# Patient Record
Sex: Female | Born: 1970 | Race: White | Hispanic: No | Marital: Married | Smoking: Current every day smoker
Health system: Southern US, Community
[De-identification: ages and names within clinical notes are randomized; demographics above are authoritative.]

## PROBLEM LIST (undated history)

## (undated) DIAGNOSIS — R519 Headache, unspecified: Secondary | ICD-10-CM

## (undated) DIAGNOSIS — I10 Essential (primary) hypertension: Secondary | ICD-10-CM

## (undated) DIAGNOSIS — K219 Gastro-esophageal reflux disease without esophagitis: Secondary | ICD-10-CM

## (undated) DIAGNOSIS — J45909 Unspecified asthma, uncomplicated: Secondary | ICD-10-CM

## (undated) DIAGNOSIS — R51 Headache: Secondary | ICD-10-CM

## (undated) DIAGNOSIS — F411 Generalized anxiety disorder: Secondary | ICD-10-CM

## (undated) DIAGNOSIS — T7840XA Allergy, unspecified, initial encounter: Secondary | ICD-10-CM

## (undated) HISTORY — DX: Allergy, unspecified, initial encounter: T78.40XA

## (undated) HISTORY — DX: Gastro-esophageal reflux disease without esophagitis: K21.9

## (undated) HISTORY — DX: Unspecified asthma, uncomplicated: J45.909

## (undated) HISTORY — DX: Headache: R51

## (undated) HISTORY — DX: Generalized anxiety disorder: F41.1

## (undated) HISTORY — DX: Headache, unspecified: R51.9

## (undated) HISTORY — DX: Essential (primary) hypertension: I10

---

## 2004-08-11 HISTORY — PX: ABDOMINAL HYSTERECTOMY: SHX81

## 2016-05-20 ENCOUNTER — Ambulatory Visit (INDEPENDENT_AMBULATORY_CARE_PROVIDER_SITE_OTHER): Payer: Managed Care, Other (non HMO) | Admitting: Primary Care

## 2016-05-20 ENCOUNTER — Encounter: Payer: Self-pay | Admitting: Primary Care

## 2016-05-20 ENCOUNTER — Encounter: Payer: Self-pay | Admitting: Radiology

## 2016-05-20 VITALS — BP 146/92 | HR 82 | Temp 98.2°F | Ht 64.0 in | Wt 192.4 lb

## 2016-05-20 DIAGNOSIS — K219 Gastro-esophageal reflux disease without esophagitis: Secondary | ICD-10-CM

## 2016-05-20 DIAGNOSIS — I10 Essential (primary) hypertension: Secondary | ICD-10-CM

## 2016-05-20 DIAGNOSIS — J4540 Moderate persistent asthma, uncomplicated: Secondary | ICD-10-CM

## 2016-05-20 DIAGNOSIS — F411 Generalized anxiety disorder: Secondary | ICD-10-CM

## 2016-05-20 DIAGNOSIS — E538 Deficiency of other specified B group vitamins: Secondary | ICD-10-CM | POA: Diagnosis not present

## 2016-05-20 LAB — VITAMIN B12: Vitamin B-12: 589 pg/mL (ref 211–911)

## 2016-05-20 MED ORDER — ATENOLOL 25 MG PO TABS: 25.0000 mg | ORAL_TABLET | Freq: Every day | ORAL | 1 refills | Status: DC

## 2016-05-20 MED ORDER — ATENOLOL 25 MG PO TABS
25.0000 mg | ORAL_TABLET | Freq: Every day | ORAL | 1 refills | Status: DC
Start: 2016-05-20 — End: 2016-10-15

## 2016-05-20 MED ORDER — CLONAZEPAM 1 MG PO TABS: 1.0000 mg | ORAL_TABLET | Freq: Two times a day (BID) | ORAL | 0 refills | Status: DC | PRN

## 2016-05-20 MED ORDER — CLONAZEPAM 1 MG PO TABS: 1.0000 mg | ORAL_TABLET | Freq: Two times a day (BID) | ORAL | 0 refills | Status: DC

## 2016-05-20 MED ORDER — BUDESONIDE 180 MCG/ACT IN AEPB: 1.0000 | INHALATION_SPRAY | Freq: Two times a day (BID) | RESPIRATORY_TRACT | 1 refills | Status: DC

## 2016-05-20 NOTE — Progress Notes (Signed)
Subjective:    Patient ID: Tamara Robinson, female    DOB: 12-29-1970, 45 y.o.   MRN: [id number]  HPI  Tamara Robinson is a 45 year old female who presents today to establish care and discuss the problems mentioned below. Will obtain old records. She just moved to the area from [place] two weeks ago and has been out of all of her medications since. Her last physical was over 1 year ago.  1) Generalized Anxiety Disorder: Diagnosed in childhood. Currently managed on Clonazepam 1 mg BID for which she's taken for 3 years, and Trazodone 50 mg HS for difficulty sleeping for which she has taken for the past 1 month. She will wake up during the night 1-2 times.   She has not had either of these medications for 2 weeks. She's previously failed treatment on Paxil, Wellbutrin, Lexapro, lorazepam, numerous other medications for which she cannot remember. She does not wish to try other medications for anxiety as she's failed numerous other treatments. Without her Clonazepam she will feel anxious, nausea, shaking. She wakes up feeling this way most of the days. Clonazepam has been the only medication that's helped her symptoms. She denies depression, suicidal thoughts. She was once managed by psychiatry and therapy without improvement. She always felt like they never listened.  2) GERD: Diagnosed 20 years ago. Currently managed on omeprazole 40 mg. She's tried OTC pepcid and zantac without improvement. She will experience sour burps, heartburn, nausea without her medication.   3) Essential Hypertension: Diagnosed one year ago, also with palpitations. Currently managed on Atenolol 25 mg once daily. Her BP is above goal in the clinic today at 146/92. She's not had her Atenolol in 2 weeks. She has noticed headaches. Denies chest pain.   4) Vitamin B 12 Deficiency: Diagnosed 3 years ago and injects herself at home once monthly. Her last B 12 injections was 2 months ago.   5) Asthma: Diagnosed as a child. She will  experiencing cough, produce a lot mucous, and wheezing daily. Managed on albuterol HFA inhaler for which she uses 2-3 times daily every day. She has smoked for the past 20 years and is currently smoking 1 PPD. Denies hemoptysis and unexplained weight loss. She has never been on a controller inhaler for her asthma.  Review of Systems  Constitutional: Negative for fever and unexpected weight change.  HENT: Positive for congestion.   Eyes: Negative for visual disturbance.  Respiratory: Positive for cough and shortness of breath. Negative for wheezing.   Cardiovascular: Negative for chest pain.  Gastrointestinal:       Esophageal reflux  Genitourinary:       Hysterectomy  Allergic/Immunologic: Positive for environmental allergies.  Neurological: Positive for headaches. Negative for dizziness.  Psychiatric/Behavioral: Positive for sleep disturbance. Negative for suicidal ideas. The patient is nervous/anxious.        Past Medical History:  Diagnosis Date  . Allergy   . Asthma   . Frequent headaches   . GAD (generalized anxiety disorder)   . GERD (gastroesophageal reflux disease)   . Hypertension      Social History   Social History  . Marital status: Married    Spouse name: N/A  . Number of children: N/A  . Years of education: N/A   Occupational History  . Not on file.   Social History Main Topics  . Smoking status: Current Every Day Smoker    Packs/day: 1.00  . Smokeless tobacco: Not on file  . Alcohol use Yes  Comment: weeky  . Drug use: Unknown  . Sexual activity: Not on file   Other Topics Concern  . Not on file   Social History Narrative  . No narrative on file    Past Surgical History:  Procedure Laterality Date  . ABDOMINAL HYSTERECTOMY  2006    No family history on file.  No Known Allergies  No current outpatient prescriptions on file prior to visit.   No current facility-administered medications on file prior to visit.     BP (!) 146/92    Pulse 82   Temp 98.2 F (36.8 C) (Oral)   Ht 5\' 4"  (1.626 m)   Wt 192 lb 6.4 oz (87.3 kg)   SpO2 97%   BMI 33.03 kg/m    Objective:   Physical Exam  Constitutional: She is oriented to person, place, and time. She appears well-nourished.  Neck: Neck supple.  Cardiovascular: Normal rate and regular rhythm.   Pulmonary/Chest: Effort normal and breath sounds normal.  Musculoskeletal: Normal range of motion.  Neurological: She is alert and oriented to person, place, and time.  Skin: Skin is warm and dry.  Psychiatric: She has a normal mood and affect.          Assessment & Plan:

## 2016-05-20 NOTE — Progress Notes (Signed)
Pre visit review using our clinic review tool, if applicable. No additional management support is needed unless otherwise documented below in the visit note. 

## 2016-05-20 NOTE — Patient Instructions (Addendum)
Start Pulmicort Inhaler for asthma. Inhale 1 puff into the lungs twice daily, everyday. You may increase to 2 puffs twice daily if little to no improvement.  Use the albuterol inhaler as needed for wheezing/shortness of breath. You should ideally be using this less than 3 times weekly.   I sent refills for Atenolol to your pharmacy.  I will temporarily refill your Clonazepam as discussed. Consider decreasing your dose to 1/2 tablet twice daily as our goal will be to wean you off.  Please sign the controlled substance contract and complete the urine drug screen as discussed. Complete lab work prior to leaving today. I will notify you of your results once received.   Please schedule a physical with me in 3 months. You may also schedule a lab only appointment 3-4 days prior. We will discuss your lab results in detail during your physical. We will then re-evaluate your anxiety.  It was a pleasure to meet you today! Please don't hesitate to call me with any questions. Welcome to [facility]!

## 2016-05-20 NOTE — Assessment & Plan Note (Signed)
Diagnosed one year ago and is managed on atenolol 25 mg. No medications and the last 2 weeks, BP above goal in clinic today. Refill provided for atenolol 25 mg. She will monitor blood pressure and notify us if no improvement.

## 2016-05-20 NOTE — Assessment & Plan Note (Signed)
Failed treatment on numerous medications from different classes. Clonazepam works well and is the only thing that she feels stable taking. Long discussion today regarding long-term effects of this medication and goals for weaning off in the future. 3 month supply provided today, discussed for her to try to break tablets in half. UDS and controlled substances contract obtained today. Will reevaluate in 3 months.

## 2016-05-20 NOTE — Assessment & Plan Note (Signed)
Managed on omeprazole 40 mg. Experiences relapse of symptoms if off medication. No improvement with H2 blockers. She understands the long-term effects of PPI management.

## 2016-05-20 NOTE — Assessment & Plan Note (Signed)
Daily shortness of breath, wheezing, cough. Using albuterol inhaler 2-3 times daily every day. Prescription for Pulmicort inhaler sent to pharmacy for daily use. Will call patient in one month for an update. Discussed goal of using albuterol 3 times weekly.

## 2016-05-20 NOTE — Assessment & Plan Note (Signed)
Managed on monthly injections. Obtain vitamin B12 level today.

## 2016-06-20 ENCOUNTER — Telehealth: Payer: Self-pay | Admitting: Primary Care

## 2016-06-20 NOTE — Telephone Encounter (Signed)
-----   Message from Doreene NestKatherine K Tima Curet, NP sent at 05/20/2016  2:43 PM EDT ----- Regarding: Asthma How's she doing on the Pulmicort inhaler for asthma? Any improvement? How many puffs is she taking at a time?

## 2016-06-26 NOTE — Telephone Encounter (Signed)
Spoken to patient. She stated that the Pulmicort inhaler is helping and very much improve. She does just 1 puff daily.

## 2016-06-26 NOTE — Telephone Encounter (Signed)
Noted  

## 2016-07-13 ENCOUNTER — Other Ambulatory Visit: Payer: Self-pay | Admitting: Primary Care

## 2016-07-13 DIAGNOSIS — J4540 Moderate persistent asthma, uncomplicated: Secondary | ICD-10-CM

## 2016-07-14 NOTE — Telephone Encounter (Signed)
Ok to refill? Electronically refill request for   budesonide (PULMICORT FLEXHALER) 180 MCG/ACT inhaler  Last prescribed and seen on 05/20/2016.

## 2016-08-07 ENCOUNTER — Other Ambulatory Visit: Payer: Self-pay | Admitting: Primary Care

## 2016-08-07 DIAGNOSIS — I10 Essential (primary) hypertension: Secondary | ICD-10-CM

## 2016-08-07 DIAGNOSIS — Z0000 Encounter for general adult medical examination without abnormal findings: Secondary | ICD-10-CM

## 2016-08-07 DIAGNOSIS — E538 Deficiency of other specified B group vitamins: Secondary | ICD-10-CM

## 2016-08-19 ENCOUNTER — Ambulatory Visit (INDEPENDENT_AMBULATORY_CARE_PROVIDER_SITE_OTHER): Payer: 59 | Admitting: Primary Care

## 2016-08-19 ENCOUNTER — Other Ambulatory Visit: Payer: Managed Care, Other (non HMO)

## 2016-08-19 ENCOUNTER — Encounter: Payer: Self-pay | Admitting: Primary Care

## 2016-08-19 VITALS — BP 120/80 | HR 87 | Ht 63.75 in | Wt 210.0 lb

## 2016-08-19 DIAGNOSIS — E049 Nontoxic goiter, unspecified: Secondary | ICD-10-CM | POA: Diagnosis not present

## 2016-08-19 DIAGNOSIS — E538 Deficiency of other specified B group vitamins: Secondary | ICD-10-CM | POA: Diagnosis not present

## 2016-08-19 DIAGNOSIS — J4540 Moderate persistent asthma, uncomplicated: Secondary | ICD-10-CM

## 2016-08-19 DIAGNOSIS — Z0000 Encounter for general adult medical examination without abnormal findings: Secondary | ICD-10-CM | POA: Diagnosis not present

## 2016-08-19 DIAGNOSIS — Z1239 Encounter for other screening for malignant neoplasm of breast: Secondary | ICD-10-CM

## 2016-08-19 DIAGNOSIS — F411 Generalized anxiety disorder: Secondary | ICD-10-CM | POA: Diagnosis not present

## 2016-08-19 DIAGNOSIS — I10 Essential (primary) hypertension: Secondary | ICD-10-CM

## 2016-08-19 DIAGNOSIS — R7303 Prediabetes: Secondary | ICD-10-CM

## 2016-08-19 DIAGNOSIS — Z1231 Encounter for screening mammogram for malignant neoplasm of breast: Secondary | ICD-10-CM

## 2016-08-19 LAB — COMPREHENSIVE METABOLIC PANEL
ALBUMIN: 4.5 g/dL (ref 3.5–5.2)
ALK PHOS: 92 U/L (ref 39–117)
ALT: 32 U/L (ref 0–35)
AST: 26 U/L (ref 0–37)
BUN: 11 mg/dL (ref 6–23)
CALCIUM: 10 mg/dL (ref 8.4–10.5)
CO2: 25 mEq/L (ref 19–32)
Chloride: 105 mEq/L (ref 96–112)
Creatinine, Ser: 0.75 mg/dL (ref 0.40–1.20)
GFR: 88.35 mL/min (ref >60.00)
Glucose, Bld: 97 mg/dL (ref 70–99)
POTASSIUM: 4.4 meq/L (ref 3.5–5.1)
SODIUM: 139 meq/L (ref 135–145)
TOTAL PROTEIN: 7.3 g/dL (ref 6.0–8.3)
Total Bilirubin: 0.3 mg/dL (ref 0.2–1.2)

## 2016-08-19 LAB — LIPID PANEL
CHOL/HDL RATIO: 3
Cholesterol: 205 mg/dL — ABNORMAL HIGH (ref 0–200)
HDL: 68.6 mg/dL (ref >39.00)
LDL Cholesterol: 122 mg/dL — ABNORMAL HIGH (ref 0–99)
NONHDL: 136.25
Triglycerides: 70 mg/dL (ref 0.0–149.0)
VLDL: 14 mg/dL (ref 0.0–40.0)

## 2016-08-19 LAB — TSH: TSH: 0.98 u[IU]/mL (ref 0.35–4.50)

## 2016-08-19 LAB — HEMOGLOBIN A1C: HEMOGLOBIN A1C: 5.8 % (ref 4.6–6.5)

## 2016-08-19 LAB — VITAMIN B12: VITAMIN B 12: 330 pg/mL (ref 211–911)

## 2016-08-19 MED ORDER — CLONAZEPAM 1 MG PO TABS: 1.0000 mg | ORAL_TABLET | Freq: Two times a day (BID) | ORAL | 0 refills | Status: DC | PRN

## 2016-08-19 NOTE — Progress Notes (Signed)
Subjective:    Patient ID: Tamara Robinson, female    DOB: [birth year], 46 y.o.   MRN: [id number]  HPI  [name] is a 46 year old female who presents today for complete physical and follow up for chronic anxiety.   Immunizations: -Tetanus: Completed within 5 years. -Influenza: Declines.  Diet: She endorses a fair diet. Breakfast: Fruit, fast food, bagel Lunch: Sandwich, fast food Dinner: Meat, vegetable, stach Snacks: Nuts, chips Desserts: Several times weekly Beverages: Water, coffee, occasional sweet tea  Exercise: She does not currently exercise. Eye exam: Completed several years ago, due soon. Dental exam: Completes annually. Pap Smear: Hysterectomy Mammogram: Completed over 10 years ago. Due.  1) GAD: Long history of anxiety and has failed numerous medications from various clasess from numerous providers. She presented as a new patient in October 2017 and was managed solely on Clonazepam for which she took twice daily. She was very anxious at that time as she had been out of her medication for several weeks. It was explained to her at that time that this was an as needed medication and that our goal was to wean her down. She was provided with a refill and told to follow up in three months.  She's been taking her Clonazepam twice daily since her last visit. She's been on Clonazepam twice daily for 3 years. She does very well on this medication. Without her Clonazepam she starts to self medicate with alcohol and marijuana. She doesn't drink alcohol while taking her medication. Her husband recently returned from being overseas for the past 10 years.  Review of Systems  Constitutional: Negative for unexpected weight change.  HENT: Positive for tinnitus. Negative for rhinorrhea.        Increased tinnitus to bilateral ears over the last 1 month.   Respiratory: Negative for cough and shortness of breath.   Cardiovascular: Negative for chest pain.  Gastrointestinal: Negative for  constipation and diarrhea.  Genitourinary: Negative for difficulty urinating and menstrual problem.       Hot flashes at night  Musculoskeletal: Negative for arthralgias and myalgias.  Skin: Negative for rash.  Allergic/Immunologic: Negative for environmental allergies.  Neurological: Negative for dizziness, numbness and headaches.  Psychiatric/Behavioral:       Anxiety much improved on Clonazepam       Past Medical History:  Diagnosis Date  . Allergy   . Asthma   . Frequent headaches   . GAD (generalized anxiety disorder)   . GERD (gastroesophageal reflux disease)   . Hypertension      Social History   Social History  . Marital status: Married    Spouse name: N/A  . Number of children: N/A  . Years of education: N/A   Occupational History  . Not on file.   Social History Main Topics  . Smoking status: Current Every Day Smoker    Packs/day: 1.00  . Smokeless tobacco: Not on file  . Alcohol use Yes     Comment: weeky  . Drug use: Unknown  . Sexual activity: Not on file   Other Topics Concern  . Not on file   Social History Narrative  . No narrative on file    Past Surgical History:  Procedure Laterality Date  . ABDOMINAL HYSTERECTOMY  2006    No family history on file.  No Known Allergies  Current Outpatient Prescriptions on File Prior to Visit  Medication Sig Dispense Refill  . albuterol (VENTOLIN HFA) 108 (90 Base) MCG/ACT inhaler Inhale 2 puffs  into the lungs every 6 (six) hours as needed for wheezing or shortness of breath.    Marland Kitchen. atenolol (TENORMIN) 25 MG tablet Take 1 tablet (25 mg total) by mouth daily. 90 tablet 1  . budesonide (PULMICORT FLEXHALER) 180 MCG/ACT inhaler Inhale 1 puff into the lungs 2 (two) times daily. 1 each 5  . omeprazole (PRILOSEC) 40 MG capsule Take 40 mg by mouth daily.    Marland Kitchen. VITAMIN D, CHOLECALCIFEROL, PO Take by mouth.     No current facility-administered medications on file prior to visit.     BP 120/80   Pulse 87    Ht 5' 3.75" (1.619 m)   Wt 210 lb (95.3 kg)   SpO2 99%   BMI 36.33 kg/m    Objective:   Physical Exam  Constitutional: She is oriented to person, place, and time. She appears well-nourished.  HENT:  Right Ear: Tympanic membrane and ear canal normal.  Left Ear: Tympanic membrane and ear canal normal.  Nose: Nose normal.  Mouth/Throat: Oropharynx is clear and moist.  Eyes: Conjunctivae and EOM are normal. Pupils are equal, round, and reactive to light.  Neck: Neck supple. No thyromegaly present.  Cardiovascular: Normal rate and regular rhythm.   No murmur heard. Pulmonary/Chest: Effort normal and breath sounds normal. She has no rales.  Abdominal: Soft. Bowel sounds are normal. There is no tenderness.  Musculoskeletal: Normal range of motion.  Lymphadenopathy:    She has no cervical adenopathy.  Neurological: She is alert and oriented to person, place, and time. She has normal reflexes. No cranial nerve deficit.  Skin: Skin is warm and dry. No rash noted.  Psychiatric: She has a normal mood and affect.  Anxiety much improved on Clonazepam.          Assessment & Plan:

## 2016-08-19 NOTE — Assessment & Plan Note (Signed)
Much improved on Pulmicort with irregular use of albuterol inhaler. Exam today without wheezing. Discussed to stop smoking.

## 2016-08-19 NOTE — Assessment & Plan Note (Signed)
Td UTD, declines influenza vaccination. Mammogram due, ordered today. Discussed the importance of a healthy diet and regular exercise in order for weight loss, and to reduce the risk of other medical diseases. Exam unremarkable. Labs with prediabetes, will continue to monitor. Follow up in 1 year for annual physical.

## 2016-08-19 NOTE — Assessment & Plan Note (Signed)
B12 stable on recent labs.

## 2016-08-19 NOTE — Patient Instructions (Addendum)
Complete lab work prior to leaving today. I will notify you of your results once received.   Call the [facility] to schedule your mammogram.  It's important to improve your diet by reducing consumption of fast food, fried food, processed snack foods, sugary drinks. Increase consumption of fresh vegetables and fruits, whole grains, water.  Ensure you are drinking 64 ounces of water daily.  Start exercising. You should be getting 150 minutes of moderate intensity exercise weekly.  Follow up in 6 months for re-evaluation or sooner if needed.  It was a pleasure to see you today!

## 2016-08-19 NOTE — Assessment & Plan Note (Signed)
Long discussion today regarding use of Clonazepam as sole treatment for anxiety. She has failed numerous medications from numerous classes prescribed by both PCP's and psychiatry.  She is much better with the Clonazepam when compared to last visit. Discussed that her body is addicted to this medication, she understands this.  Discussed that she cannot drink alcohol or smoke marijiana with this medication, she verbalized understanding. Since she's tried nearly every medication agreed to continue Clonazepam. UDS and controlled substance contract UTD. She will follow up every 6 months for re-evaluation.

## 2016-08-19 NOTE — Assessment & Plan Note (Signed)
Likely from poor diet and lack of exercise. Discussed to work on both, recheck A1C in 6 months.

## 2016-08-19 NOTE — Assessment & Plan Note (Signed)
Stable today, continue atenolol.

## 2016-08-21 ENCOUNTER — Encounter: Payer: Managed Care, Other (non HMO) | Admitting: Primary Care

## 2016-10-06 ENCOUNTER — Ambulatory Visit: Payer: 59 | Admitting: Primary Care

## 2016-10-15 ENCOUNTER — Other Ambulatory Visit: Payer: Self-pay | Admitting: Primary Care

## 2016-10-15 DIAGNOSIS — I10 Essential (primary) hypertension: Secondary | ICD-10-CM

## 2016-10-15 DIAGNOSIS — K219 Gastro-esophageal reflux disease without esophagitis: Secondary | ICD-10-CM

## 2016-10-15 NOTE — Telephone Encounter (Signed)
Ok to refill? Electronically refill request for omeprazole (PRILOSEC) 40 MG capsule. Medication have not been prescribed by Jae DireKate. Last seen on 08/19/2016

## 2016-11-12 ENCOUNTER — Other Ambulatory Visit: Payer: Self-pay | Admitting: Primary Care

## 2016-11-12 DIAGNOSIS — F411 Generalized anxiety disorder: Secondary | ICD-10-CM

## 2016-11-12 NOTE — Telephone Encounter (Signed)
Ok to refill? Electronically refill request for clonazePAM (KLONOPIN) 1 MG tablet. Last prescribed and seen on 08/19/2016.

## 2016-11-13 NOTE — Telephone Encounter (Deleted)
Patient is due for USD.

## 2016-11-13 NOTE — Addendum Note (Signed)
Addended by: Tawnya Crook on: 11/13/2016 03:55 PM   Modules accepted: Orders

## 2016-11-14 ENCOUNTER — Other Ambulatory Visit: Payer: Self-pay | Admitting: Primary Care

## 2016-11-14 DIAGNOSIS — F411 Generalized anxiety disorder: Secondary | ICD-10-CM

## 2016-11-14 NOTE — Telephone Encounter (Signed)
Called in medication to the pharmacy as instructed. 

## 2016-11-21 ENCOUNTER — Other Ambulatory Visit: Payer: Self-pay | Admitting: Primary Care

## 2016-11-21 DIAGNOSIS — I10 Essential (primary) hypertension: Secondary | ICD-10-CM

## 2016-11-21 MED ORDER — ATENOLOL 25 MG PO TABS
25.0000 mg | ORAL_TABLET | Freq: Every day | ORAL | 0 refills | Status: DC
Start: 2016-11-21 — End: 2017-02-20

## 2016-11-21 NOTE — Telephone Encounter (Signed)
Received faxed refill request for atenolol (TENORMIN) 25 MG tablet. Last prescribed on 10/15/2016.   Will change to 90 days supply.

## 2017-01-09 ENCOUNTER — Ambulatory Visit: Payer: Self-pay | Admitting: Internal Medicine

## 2017-01-09 ENCOUNTER — Encounter (HOSPITAL_COMMUNITY): Payer: Self-pay | Admitting: Emergency Medicine

## 2017-01-09 ENCOUNTER — Emergency Department (HOSPITAL_COMMUNITY)
Admission: EM | Admit: 2017-01-09 | Discharge: 2017-01-09 | Disposition: A | Discharge status: Home / Self Care | Payer: 59 | Attending: Physician Assistant | Admitting: Physician Assistant

## 2017-01-09 ENCOUNTER — Telehealth: Payer: Self-pay | Admitting: Primary Care

## 2017-01-09 ENCOUNTER — Emergency Department (HOSPITAL_COMMUNITY): Payer: 59

## 2017-01-09 DIAGNOSIS — J45909 Unspecified asthma, uncomplicated: Secondary | ICD-10-CM | POA: Insufficient documentation

## 2017-01-09 DIAGNOSIS — I10 Essential (primary) hypertension: Secondary | ICD-10-CM | POA: Insufficient documentation

## 2017-01-09 DIAGNOSIS — Z79899 Other long term (current) drug therapy: Secondary | ICD-10-CM | POA: Insufficient documentation

## 2017-01-09 DIAGNOSIS — F17200 Nicotine dependence, unspecified, uncomplicated: Secondary | ICD-10-CM | POA: Diagnosis not present

## 2017-01-09 DIAGNOSIS — M5442 Lumbago with sciatica, left side: Secondary | ICD-10-CM

## 2017-01-09 DIAGNOSIS — M545 Low back pain: Secondary | ICD-10-CM | POA: Diagnosis present

## 2017-01-09 LAB — URINALYSIS, ROUTINE W REFLEX MICROSCOPIC
Bilirubin Urine: NEGATIVE
Glucose, UA: NEGATIVE mg/dL
Hgb urine dipstick: NEGATIVE
KETONES UR: NEGATIVE mg/dL
LEUKOCYTES UA: NEGATIVE
NITRITE: NEGATIVE
PH: 5 (ref 5.0–8.0)
PROTEIN: NEGATIVE mg/dL
Specific Gravity, Urine: 1.006 (ref 1.005–1.030)

## 2017-01-09 MED ORDER — ONDANSETRON HCL 4 MG PO TABS
4.0000 mg | ORAL_TABLET | Freq: Once | ORAL | Status: AC
  Administered 2017-01-09: 4 mg via ORAL
  Filled 2017-01-09: qty 1

## 2017-01-09 MED ORDER — LIDOCAINE 5 % EX PTCH
1.0000 | MEDICATED_PATCH | CUTANEOUS | Status: DC
  Administered 2017-01-09: 1 via TRANSDERMAL
  Filled 2017-01-09: qty 1

## 2017-01-09 MED ORDER — CYCLOBENZAPRINE HCL 10 MG PO TABS
5.0000 mg | ORAL_TABLET | Freq: Once | ORAL | Status: AC
  Administered 2017-01-09: 5 mg via ORAL
  Filled 2017-01-09: qty 1

## 2017-01-09 MED ORDER — NAPROXEN 250 MG PO TABS
500.0000 mg | ORAL_TABLET | Freq: Once | ORAL | Status: AC
  Administered 2017-01-09: 500 mg via ORAL
  Filled 2017-01-09: qty 2

## 2017-01-09 MED ORDER — NAPROXEN 500 MG PO TABS: 500.0000 mg | ORAL_TABLET | Freq: Two times a day (BID) | ORAL | 0 refills | Status: DC

## 2017-01-09 MED ORDER — CYCLOBENZAPRINE HCL 10 MG PO TABS: 10.0000 mg | ORAL_TABLET | Freq: Two times a day (BID) | ORAL | 0 refills | Status: DC | PRN

## 2017-01-09 MED ORDER — OXYCODONE-ACETAMINOPHEN 5-325 MG PO TABS
1.0000 | ORAL_TABLET | Freq: Once | ORAL | Status: AC
  Administered 2017-01-09: 1 via ORAL
  Filled 2017-01-09: qty 1

## 2017-01-09 NOTE — Telephone Encounter (Signed)
Pt has appt with Dr Oliver BarreJames John on 01/09/17 at 4:15.

## 2017-01-09 NOTE — ED Notes (Signed)
Pt has returned from xray

## 2017-01-09 NOTE — Discharge Instructions (Signed)
Xrays were reassuring, we want you to continue to use back exercises, go to her primary care physician get a referral for physical therapy. You may require additional imaging if your back pain continues. Please return with any that symptoms we discussed as being concerning.

## 2017-01-09 NOTE — ED Provider Notes (Signed)
[facility]-EMERGENCY DEPT Provider Note   CSN: [id number] Arrival date & time: 01/09/17  1059     History   Chief Complaint Chief Complaint  Patient presents with  . Back Pain    HPI Tamara Robinson is a 46 y.o. female.   Back Pain   This is a new problem. The current episode started 2 days ago. The problem occurs constantly. The problem has not changed since onset.The pain is associated with no known injury. The pain is present in the lumbar spine. The quality of the pain is described as stabbing and shooting. The pain radiates to the left thigh. The pain is at a severity of 4/10. The pain is moderate. The symptoms are aggravated by twisting, bending and certain positions. The pain is the same all the time. Pertinent negatives include no chest pain, no fever, no numbness, no weight loss, no bowel incontinence, no bladder incontinence, no dysuria, no tingling and no weakness. She has tried NSAIDs and bed rest for the symptoms. The treatment provided no relief.       Past Medical History:  Diagnosis Date  . Allergy   . Asthma   . Frequent headaches   . GAD (generalized anxiety disorder)   . GERD (gastroesophageal reflux disease)   . Hypertension     Patient Active Problem List   Diagnosis Date Noted  . Preventative health care 08/19/2016  . Prediabetes 08/19/2016  . Essential hypertension 05/20/2016  . Moderate persistent asthma without complication 05/20/2016  . GAD (generalized anxiety disorder) 05/20/2016  . Vitamin B 12 deficiency 05/20/2016  . GERD (gastroesophageal reflux disease) 05/20/2016    Past Surgical History:  Procedure Laterality Date  . ABDOMINAL HYSTERECTOMY  2006    OB History    No data available       Home Medications    Prior to Admission medications   Medication Sig Start Date End Date Taking? Authorizing Provider  albuterol (VENTOLIN HFA) 108 (90 Base) MCG/ACT inhaler Inhale 2 puffs into the lungs every 6 (six) hours as needed for wheezing or  shortness of breath.   Yes [provider]  atenolol (TENORMIN) 25 MG tablet Take 1 tablet (25 mg total) by mouth daily. 11/21/16  Yes Doreene Nest, NP  budesonide (PULMICORT FLEXHALER) 180 MCG/ACT inhaler Inhale 1 puff into the lungs 2 (two) times daily. 07/14/16  Yes Doreene Nest, NP  clonazePAM (KLONOPIN) 1 MG tablet TAKE 1 TABLET BY MOUTH TWICE A DAY AS NEEDED FOR ANXIETY. USE THIS MEDICATION SPARINGLY 11/12/16  Yes Doreene Nest, NP  Multiple Vitamin (MULTIVITAMIN) capsule Take 1 capsule by mouth daily.   Yes [provider]  omeprazole (PRILOSEC) 40 MG capsule TAKE ONE CAPSULE BY MOUTH EVERY DAY FOR30 DAYS 10/15/16  Yes Doreene Nest, NP  cyclobenzaprine (FLEXERIL) 10 MG tablet Take 1 tablet (10 mg total) by mouth 2 (two) times daily as needed for muscle spasms. 01/09/17   Eagan Shifflett Lyn, MD  naproxen (NAPROSYN) 500 MG tablet Take 1 tablet (500 mg total) by mouth 2 (two) times daily. 01/09/17   Akayla Brass, Cindee Salt, MD    Family History No family history on file.  Social History Social History  Substance Use Topics  . Smoking status: Current Every Day Smoker    Packs/day: 1.00  . Smokeless tobacco: Not on file  . Alcohol use Yes     Comment: weeky     Allergies   Patient has no known allergies.   Review of Systems  Review of Systems  Constitutional: Negative for fever and weight loss.  Cardiovascular: Negative for chest pain.  Gastrointestinal: Negative for bowel incontinence.  Genitourinary: Negative for bladder incontinence and dysuria.  Musculoskeletal: Positive for back pain.  Neurological: Negative for tingling, weakness and numbness.     Physical Exam Updated Vital Signs BP 123/89   Pulse 72   Temp 97.2 F (36.2 C) (Oral)   Resp 12   SpO2 96%   Physical Exam  Constitutional: She is oriented to person, place, and time. She appears well-developed and well-nourished.  HENT:  Head: Normocephalic and atraumatic.  Eyes:  Right eye exhibits no discharge.  Cardiovascular: Normal rate, regular rhythm and normal heart sounds.   No murmur heard. Pulmonary/Chest: Effort normal and breath sounds normal. She has no wheezes. She has no rales.  Abdominal: Soft. She exhibits no distension. There is no tenderness.  Musculoskeletal:  "Tenderness to SI joint and  mid gluteal region. Right and left straight leg test and negative.  Neurological: She is oriented to person, place, and time.  Skin: Skin is warm and dry. She is not diaphoretic.  Psychiatric: She has a normal mood and affect.  Nursing note and vitals reviewed.    ED Treatments / Results  Labs (all labs ordered are listed, but only abnormal results are displayed) Labs Reviewed  URINALYSIS, ROUTINE W REFLEX MICROSCOPIC - Abnormal; Notable for the following:       Result Value   Color, Urine STRAW (*)    All other components within normal limits    EKG  EKG Interpretation None       Radiology Dg Lumbar Spine Complete  Result Date: 01/09/2017 CLINICAL DATA:  Low back pain for 3 weeks.  No reported injury. EXAM: LUMBAR SPINE - COMPLETE 4+ VIEW COMPARISON:  None. FINDINGS: This report assumes 5 non rib-bearing lumbar vertebrae. Lumbar vertebral body heights are preserved, with no fracture. Minimal multilevel spondylosis in the visualized thoracolumbar spine, without appreciable loss of disc height. No spondylolisthesis. No significant facet arthropathy. No aggressive appearing focal osseous lesions. Abdominal aortic atherosclerosis. IMPRESSION: 1. No lumbar spine fracture or spondylolisthesis. 2. Minimal thoracolumbar spondylosis, with no appreciable loss of disc height . 3. Aortic atherosclerosis. Electronically Signed   By: Delbert PhenixJason A Poff M.D.   On: 01/09/2017 13:17   Dg Hip Unilat W Or Wo Pelvis 2-3 Views Left  Result Date: 01/09/2017 CLINICAL DATA:  Nontraumatic left hip pain. EXAM: DG HIP (WITH OR WITHOUT PELVIS) 2-3V LEFT COMPARISON:  None. FINDINGS:  There is no evidence of hip fracture or dislocation. There is no evidence of arthropathy or other focal bone abnormality. IMPRESSION: Negative. Electronically Signed   By: Marnee SpringJonathon  Watts M.D.   On: 01/09/2017 13:10    Procedures Procedures (including critical care time)  Medications Ordered in ED Medications  lidocaine (LIDODERM) 5 % 1 patch (1 patch Transdermal Patch Applied 01/09/17 1308)  oxyCODONE-acetaminophen (PERCOCET/ROXICET) 5-325 MG per tablet 1 tablet (not administered)  ondansetron (ZOFRAN) tablet 4 mg (not administered)  naproxen (NAPROSYN) tablet 500 mg (500 mg Oral Given 01/09/17 1227)  cyclobenzaprine (FLEXERIL) tablet 5 mg (5 mg Oral Given 01/09/17 1228)     Initial Impression / Assessment and Plan / ED Course  I have reviewed the triage vital signs and the nursing notes.  Pertinent labs & imaging results that were available during my care of the patient were reviewed by me and considered in my medical decision making (see chart for details).     A traumatic  back pain. She reports it occasional shoots down on her left leg. Patient has no red flags. Going on for 3 weeks. We'll have her follow-up with her primary care physician, given physical therapy, possibly advanced imaging in the future. Patient has no weakness no numbness or urinary symptoms no fevers.    Final Clinical Impressions(s) / ED Diagnoses   Final diagnoses:  Acute left-sided low back pain with left-sided sciatica    New Prescriptions New Prescriptions   CYCLOBENZAPRINE (FLEXERIL) 10 MG TABLET    Take 1 tablet (10 mg total) by mouth 2 (two) times daily as needed for muscle spasms.   NAPROXEN (NAPROSYN) 500 MG TABLET    Take 1 tablet (500 mg total) by mouth 2 (two) times daily.     Abelino Derrick, MD 01/09/17 1358

## 2017-01-09 NOTE — ED Notes (Signed)
Pt A&OX4, ambulatory at d/c with independent, steady gait, NAD. Pt verbalized understanding of d/c instructions and follow up care.

## 2017-01-09 NOTE — ED Triage Notes (Signed)
Pt reports having lower left back pain that began 3 weeks ago, she stated originally pain radiated around to abdomen but abdominal pain subsided and now pain radiates down her left leg to her knee and ankle. Pt denies any urinary symptoms.

## 2017-01-09 NOTE — Telephone Encounter (Signed)
[facility] - Client TELEPHONE ADVICE RECORD [facility] Patient Name: Tamara CornerDAWN Lietz DOB: 1970/12/16 Initial Comment Caller states, she has lower left back pain - 3 weeks. She is thinking it could be kidney stones. How have knee and ankle pain. Nurse Assessment Nurse: Debera Latalston, RN, Tinnie GensJeffrey Date/Time Lamount Cohen(Eastern Time): 01/09/2017 9:29:10 AM Confirm and document reason for call. If symptomatic, describe symptoms. ---Caller states, she has lower left back pain - 3 weeks. She is thinking it could be kidney stones. How have knee and ankle pain. Pain is 7 on 0-10 scale. Knee and ankle pain started in the last 3 days. Does the patient have any new or worsening symptoms? ---Yes Will a triage be completed? ---Yes Related visit to physician within the last 2 weeks? ---No Does the PT have any chronic conditions? (i.e. diabetes, asthma, etc.) ---Yes List chronic conditions. ---HTN Is the patient pregnant or possibly pregnant? (Ask all females between the ages of 8312-55) ---No Is this a behavioral health or substance abuse call? ---No Guidelines Guideline Title Affirmed Question Affirmed Notes Back Pain [1] Pain radiates into the thigh or further down the leg AND [2] one leg Final Disposition User See PCP When Office is Open (within 3 days) Debera Latalston, RN, Tinnie GensJeffrey Disagree/Comply: Comply

## 2017-02-04 ENCOUNTER — Ambulatory Visit (INDEPENDENT_AMBULATORY_CARE_PROVIDER_SITE_OTHER): Payer: 59 | Admitting: Primary Care

## 2017-02-04 ENCOUNTER — Encounter: Payer: Self-pay | Admitting: Primary Care

## 2017-02-04 VITALS — BP 118/78 | HR 66 | Temp 98.0°F | Ht 63.75 in | Wt 209.1 lb

## 2017-02-04 DIAGNOSIS — Z720 Tobacco use: Secondary | ICD-10-CM | POA: Diagnosis not present

## 2017-02-04 DIAGNOSIS — K219 Gastro-esophageal reflux disease without esophagitis: Secondary | ICD-10-CM | POA: Diagnosis not present

## 2017-02-04 DIAGNOSIS — M5442 Lumbago with sciatica, left side: Secondary | ICD-10-CM | POA: Diagnosis not present

## 2017-02-04 DIAGNOSIS — J4540 Moderate persistent asthma, uncomplicated: Secondary | ICD-10-CM

## 2017-02-04 DIAGNOSIS — F411 Generalized anxiety disorder: Secondary | ICD-10-CM | POA: Diagnosis not present

## 2017-02-04 DIAGNOSIS — I10 Essential (primary) hypertension: Secondary | ICD-10-CM | POA: Diagnosis not present

## 2017-02-04 MED ORDER — CYCLOBENZAPRINE HCL 10 MG PO TABS: 10.0000 mg | ORAL_TABLET | Freq: Two times a day (BID) | ORAL | 0 refills | Status: DC | PRN

## 2017-02-04 MED ORDER — BUPROPION HCL ER (SR) 150 MG PO TB12: 150.0000 mg | ORAL_TABLET | Freq: Two times a day (BID) | ORAL | 1 refills | Status: DC

## 2017-02-04 MED ORDER — CLONAZEPAM 1 MG PO TABS: ORAL_TABLET | ORAL | 0 refills | Status: DC

## 2017-02-04 MED ORDER — RANITIDINE HCL 150 MG PO TABS: ORAL_TABLET | ORAL | 1 refills | Status: DC

## 2017-02-04 NOTE — Assessment & Plan Note (Signed)
Will have her try Zantac once to twice daily rather than omeprazole. She will update if symptoms re-occur.

## 2017-02-04 NOTE — Assessment & Plan Note (Signed)
Stable in the office today, continue atenolol.  

## 2017-02-04 NOTE — Patient Instructions (Signed)
You may use the cyclobenzaprine tablets twice daily as needed for muscle spasms/low back pain.  Use the Pulmicort twice daily, everyday. Please notify me if you continue to have to use your albuterol inhaler daily.   Try ranitidine 150 mg tablets for GERD. Take 1 tablet by mouth once or twice daily. This is in place of the omeprazole.  Monitor your blood pressure and call in your readings in 2 weeks.  Start bupropion 150 mg tablets for smoking. Start by taking 1 tablet by mouth once daily for 3 days, then increase to 1 tablet twice daily there after. You MUST choose a quit date within 2 weeks of starting.  Stop by the lab for the urine drug screen.  Follow up in 6 months for re-evaluation.  It was a pleasure to see you today!

## 2017-02-04 NOTE — Progress Notes (Signed)
Subjective:    Patient ID: Tamara Robinson, female    DOB: July 30, 1971, 46 y.o.   MRN: [id number]  HPI  Tamara Robinson is a 46 year old female who presents today for follow up.  1) GAD: Currently managed on Clonazepam 1 mg BID. She's failed numerous medications from other classes for anxiety and does the best on Clonazepam. She's open to trying Wellbutrin for tobacco sensation. She is needing a refill today.  2) GERD: Currently managed on omeprazole 40 mg. She's missed several doses without symptoms. She's never tried H2 Blockers or a reduction in this dose.  3) Asthma: Currently managed on Pulmicort and albuterol. She's using her albuterol sparingly most of the time, more recently increased due to the weather changes. She's using her Pulmicort once daily rather that twice daily.  4) Essential Hypertension: Currently managed on atenolol 25 mg. Her BP in the clinic today is 120/92, 118/78 on recheck. She's checking her BP at home and is getting "high" readings but cannot remember the numbers. She denies chest pain, dizziness, headaches.  BP Readings from Last 3 Encounters:  02/04/17 118/78  01/09/17 128/74  08/19/16 120/80     5) Low Back Pain: Located to the left lower back with radiation to her left lower extremity. [facility] for a three week history of low back pain with radiculopathy, underwent lumbar plain films which were unremarkable. Overall she's feeling better on Motrin and Flexeril. She's out of the Flexeril which helped. She was taking Flexeril once daily in the afternoon.  6) Tobacco Abuse: Previously managed on Chantix in the past, had vivid dreams of a naked man climbing her bedroom walls. No other side effects. She is currently smoking 1 pack per day. She's been smoking since age 46. She is ready to quit smoking and would like assistance.  Review of Systems  Eyes: Negative for visual disturbance.  Respiratory: Negative for shortness of breath.     Cardiovascular: Negative for chest pain.  Gastrointestinal:       Occasional gerd symptoms  Musculoskeletal: Positive for back pain.  Neurological: Negative for dizziness and headaches.  Psychiatric/Behavioral: Negative for sleep disturbance and suicidal ideas. The patient is not nervous/anxious.        Past Medical History:  Diagnosis Date  . Allergy   . Asthma   . Frequent headaches   . GAD (generalized anxiety disorder)   . GERD (gastroesophageal reflux disease)   . Hypertension      Social History   Social History  . Marital status: Married    Spouse name: N/A  . Number of children: N/A  . Years of education: N/A   Occupational History  . Not on file.   Social History Main Topics  . Smoking status: Current Every Day Smoker    Packs/day: 1.00  . Smokeless tobacco: Never Used  . Alcohol use Yes     Comment: weeky  . Drug use: Unknown  . Sexual activity: Not on file   Other Topics Concern  . Not on file   Social History Narrative  . No narrative on file    Past Surgical History:  Procedure Laterality Date  . ABDOMINAL HYSTERECTOMY  2006    No family history on file.  No Known Allergies  Current Outpatient Prescriptions on File Prior to Visit  Medication Sig Dispense Refill  . albuterol (VENTOLIN HFA) 108 (90 Base) MCG/ACT inhaler Inhale 2 puffs into the lungs every 6 (six) hours as needed for  wheezing or shortness of breath.    Marland Kitchen atenolol (TENORMIN) 25 MG tablet Take 1 tablet (25 mg total) by mouth daily. 90 tablet 0  . budesonide (PULMICORT FLEXHALER) 180 MCG/ACT inhaler Inhale 1 puff into the lungs 2 (two) times daily. 1 each 5  . Multiple Vitamin (MULTIVITAMIN) capsule Take 1 capsule by mouth daily.    Marland Kitchen omeprazole (PRILOSEC) 40 MG capsule TAKE ONE CAPSULE BY MOUTH EVERY DAY FOR30 DAYS 90 capsule 3   No current facility-administered medications on file prior to visit.     BP 118/78   Pulse 66   Temp 98 F (36.7 C) (Oral)   Ht 5' 3.75" (1.619  m)   Wt 209 lb 1.9 oz (94.9 kg)   SpO2 97%   BMI 36.18 kg/m    Objective:   Physical Exam  Constitutional: She appears well-nourished.  Neck: Neck supple.  Cardiovascular: Normal rate and regular rhythm.   Pulmonary/Chest: Effort normal and breath sounds normal.  Musculoskeletal: Normal range of motion.  Skin: Skin is warm and dry.  Psychiatric: She has a normal mood and affect.          Assessment & Plan:

## 2017-02-04 NOTE — Assessment & Plan Note (Signed)
Not using pulmicort as prescribed, will have her increase dose to BID. Discussed to notify if she requires use of her albuterol more than three times weekly. She will update.

## 2017-02-04 NOTE — Assessment & Plan Note (Signed)
Present for 6 weeks, overall better. Rx for Flexeril sent to pharmacy to use PRN. If no improvement then consider PT/MRI.

## 2017-02-04 NOTE — Assessment & Plan Note (Signed)
Doing well on Clonazepam. Wellbutrin initiated for tobacco abuse, will monitor. UDS obtained today.

## 2017-02-04 NOTE — Assessment & Plan Note (Signed)
30 pack year history, ready to quit. Experienced side effects with Chantix. Will trial Wellbutrin, discussed potential side effects.

## 2017-02-20 ENCOUNTER — Other Ambulatory Visit: Payer: Self-pay | Admitting: Primary Care

## 2017-02-20 DIAGNOSIS — I10 Essential (primary) hypertension: Secondary | ICD-10-CM

## 2017-05-10 ENCOUNTER — Other Ambulatory Visit: Payer: Self-pay | Admitting: Primary Care

## 2017-05-10 DIAGNOSIS — F411 Generalized anxiety disorder: Secondary | ICD-10-CM

## 2017-05-11 NOTE — Telephone Encounter (Signed)
Okay to refill, please phone in #[phone], no refills.

## 2017-05-11 NOTE — Telephone Encounter (Signed)
Ok to refill? Electronically refill request for clonazePAM (KLONOPIN) 1 MG tablet  Last prescribed and seen on 02/04/2017. UDS is up to date

## 2017-05-12 ENCOUNTER — Other Ambulatory Visit: Payer: Self-pay | Admitting: Primary Care

## 2017-05-12 DIAGNOSIS — F411 Generalized anxiety disorder: Secondary | ICD-10-CM

## 2017-05-12 NOTE — Telephone Encounter (Signed)
Called in medication to the pharmacy as instructed. 

## 2017-07-11 ENCOUNTER — Other Ambulatory Visit: Payer: Self-pay | Admitting: Primary Care

## 2017-07-11 DIAGNOSIS — J4540 Moderate persistent asthma, uncomplicated: Secondary | ICD-10-CM

## 2017-07-20 ENCOUNTER — Ambulatory Visit: Payer: Self-pay | Admitting: Primary Care

## 2017-07-24 ENCOUNTER — Other Ambulatory Visit: Payer: Self-pay | Admitting: Primary Care

## 2017-07-24 DIAGNOSIS — I10 Essential (primary) hypertension: Secondary | ICD-10-CM

## 2017-07-24 DIAGNOSIS — Z720 Tobacco use: Secondary | ICD-10-CM

## 2017-07-29 ENCOUNTER — Ambulatory Visit (INDEPENDENT_AMBULATORY_CARE_PROVIDER_SITE_OTHER): Payer: No Typology Code available for payment source | Admitting: Primary Care

## 2017-07-29 ENCOUNTER — Encounter: Payer: Self-pay | Admitting: Primary Care

## 2017-07-29 VITALS — BP 122/80 | HR 68 | Temp 98.1°F | Ht 63.75 in | Wt 212.4 lb

## 2017-07-29 DIAGNOSIS — I10 Essential (primary) hypertension: Secondary | ICD-10-CM

## 2017-07-29 DIAGNOSIS — R21 Rash and other nonspecific skin eruption: Secondary | ICD-10-CM | POA: Diagnosis not present

## 2017-07-29 DIAGNOSIS — F411 Generalized anxiety disorder: Secondary | ICD-10-CM | POA: Diagnosis not present

## 2017-07-29 DIAGNOSIS — K219 Gastro-esophageal reflux disease without esophagitis: Secondary | ICD-10-CM

## 2017-07-29 DIAGNOSIS — Z720 Tobacco use: Secondary | ICD-10-CM

## 2017-07-29 DIAGNOSIS — J4540 Moderate persistent asthma, uncomplicated: Secondary | ICD-10-CM | POA: Diagnosis not present

## 2017-07-29 MED ORDER — TRIAMCINOLONE ACETONIDE 0.5 % EX OINT: 1.0000 "application " | TOPICAL_OINTMENT | Freq: Two times a day (BID) | CUTANEOUS | 0 refills | Status: DC

## 2017-07-29 MED ORDER — ALBUTEROL SULFATE HFA 108 (90 BASE) MCG/ACT IN AERS: 2.0000 | INHALATION_SPRAY | Freq: Four times a day (QID) | RESPIRATORY_TRACT | 0 refills | Status: AC | PRN

## 2017-07-29 MED ORDER — CLONAZEPAM 1 MG PO TABS: ORAL_TABLET | ORAL | 0 refills | Status: DC

## 2017-07-29 NOTE — Assessment & Plan Note (Addendum)
Compliant to Pulmicort BID, using albuterol sparingly. Refill for albuterol provided today. Commended her on smoking reduction.

## 2017-07-29 NOTE — Assessment & Plan Note (Signed)
Down to 3-5 cigarettes daily, commended her on this. Encouraged her to quit.

## 2017-07-29 NOTE — Progress Notes (Signed)
Subjective:    Patient ID: Tamara Robinson, female    DOB: [birth year], 46 y.o.   MRN: [id number]  HPI  Tamara Robinson is a 46 year old female who presents today for medication refill and a chief complaint of rash.  1) Rash: Several bumps noted to bilateral calves that have been present for the past one year. Over the past several months she's noticed breakouts to her chest and left buttocks. She thinks her dogs are "brining in something". Her bumps are not itchy on a daily basis. She's tried Calodryl OTC and takes Benadryl without any improvement.   2) GAD: Currently managed on Clonazepam 1 mg BID for which she takes daily and has done so for years. She's failed numerous psychotropic medications including SSRI's, SNRI's, Wellbutrin, etc. She is needing a refill today. She is doing well on the clonazepam and would like to continue.   3) Essential Hypertension: Currently managed on atenolol 25 mg once daily. She denies chest pain, dizziness, headaches.   4) Asthma: Currently managed on Pulmicort BID and albuterol PRN. She is down to 3-5 cigarettes daily, continues to work on smoking cessation. She is using her albuterol inhaler infrequently.   5) GERD: Currently managed on omeprazole 40 mg. She is taking this infrequently. Working on cutting back on smoking.   Review of Systems  Eyes: Negative for visual disturbance.  Respiratory: Negative for shortness of breath.   Cardiovascular: Negative for chest pain.  Neurological: Negative for dizziness and headaches.  Psychiatric/Behavioral: Negative for agitation. The patient is not nervous/anxious.        Past Medical History:  Diagnosis Date  . Allergy   . Asthma   . Frequent headaches   . GAD (generalized anxiety disorder)   . GERD (gastroesophageal reflux disease)   . Hypertension      Social History   Socioeconomic History  . Marital status: Married    Spouse name: Not on file  . Number of children: Not on file  . Years of education:  Not on file  . Highest education level: Not on file  Social Needs  . Financial resource strain: Not on file  . Food insecurity - worry: Not on file  . Food insecurity - inability: Not on file  . Transportation needs - medical: Not on file  . Transportation needs - non-medical: Not on file  Occupational History  . Not on file  Tobacco Use  . Smoking status: Current Every Day Smoker    Packs/day: 1.00  . Smokeless tobacco: Never Used  Substance and Sexual Activity  . Alcohol use: Yes    Comment: weeky  . Drug use: Not on file  . Sexual activity: Not on file  Other Topics Concern  . Not on file  Social History Narrative  . Not on file    Past Surgical History:  Procedure Laterality Date  . ABDOMINAL HYSTERECTOMY  2006    No family history on file.  No Known Allergies  Current Outpatient Medications on File Prior to Visit  Medication Sig Dispense Refill  . albuterol (VENTOLIN HFA) 108 (90 Base) MCG/ACT inhaler Inhale 2 puffs into the lungs every 6 (six) hours as needed for wheezing or shortness of breath.    Marland Kitchen. atenolol (TENORMIN) 25 MG tablet TAKE 1 TABLET BY MOUTH EVERY DAY 90 tablet 1  . clonazePAM (KLONOPIN) 1 MG tablet TAKE 1 TABLET TWICE A DAY AS NEEDED FOR ANXIETY (USE SPARINGLY) 180 tablet 0  . omeprazole (PRILOSEC) 40 MG  capsule TAKE ONE CAPSULE BY MOUTH EVERY DAY FOR30 DAYS 90 capsule 3  . PULMICORT FLEXHALER 180 MCG/ACT inhaler INHALE 1 PUFF INTO THE LUNGS 2 (TWO) TIMES DAILY. 1 each 5   No current facility-administered medications on file prior to visit.     BP 122/80   Pulse 68   Temp 98.1 F (36.7 C) (Oral)   Ht 5' 3.75" (1.619 m)   Wt 212 lb 6.4 oz (96.3 kg)   SpO2 95%   BMI 36.74 kg/m    Objective:   Physical Exam  Constitutional: She appears well-nourished.  Neck: Neck supple.  Cardiovascular: Normal rate and regular rhythm.  Pulmonary/Chest: Effort normal and breath sounds normal.  Skin: Skin is warm and dry.  Psychiatric: She has a normal  mood and affect.          Assessment & Plan:

## 2017-07-29 NOTE — Assessment & Plan Note (Signed)
Could not tolerate Wellbutrin, has also failed numerous SSRI's, SNIR's, etc. Continue clonazepam. Discussed to use sparingly. Discussed long term effects of Benzo use, she verbalized understanding.

## 2017-07-29 NOTE — Assessment & Plan Note (Signed)
Stable in the office today, continue atenolol.  

## 2017-07-29 NOTE — Assessment & Plan Note (Signed)
Pepcid ineffective, much improved back on omeprazole 40 mg. Continue same.

## 2017-07-29 NOTE — Patient Instructions (Addendum)
We will phone in your Clonazepam later this afternoon.  Use the albuterol sparingly as needed.  Apply the triamcinolone ointment twice daily to the legs and chest for the next one week and then as needed, do not use on the face.   You may try 1% hydrocortisone cream to the face once daily as needed. Do this for about one week.  You are due for your physical in 2019, please schedule this at your convenience.   It was a pleasure to see you today!

## 2017-08-05 ENCOUNTER — Telehealth: Payer: Self-pay | Admitting: Primary Care

## 2017-08-05 NOTE — Telephone Encounter (Signed)
Copied from CRM [phone]. Topic: Quick Communication - See Telephone Encounter >> Aug 05, 2017  8:56 AM Arlyss Gandyichardson, Kaniel Kiang N, NT wrote: CRM for notification. See Telephone encounter for: Pt wanting to refill her klonopin but the pharmacy will not let her refill it since it is 4-5 days early. She wants to see if Mayra ReelKate Clark can call and give permission to fill it. [facility] on [address]   08/05/17.

## 2017-08-05 NOTE — Telephone Encounter (Signed)
Unfortunately we cannot fill this prescription early. She should have enough from the prior prescription to last her until the next fill date.

## 2017-08-05 NOTE — Telephone Encounter (Signed)
Spoken and notified patient of Kate's comments. Patient verbalized understanding. 

## 2017-11-11 ENCOUNTER — Encounter: Payer: Self-pay | Admitting: Primary Care

## 2017-11-11 ENCOUNTER — Ambulatory Visit: Payer: Self-pay | Admitting: Primary Care

## 2017-11-11 DIAGNOSIS — K219 Gastro-esophageal reflux disease without esophagitis: Secondary | ICD-10-CM

## 2017-11-11 DIAGNOSIS — F411 Generalized anxiety disorder: Secondary | ICD-10-CM

## 2017-11-11 MED ORDER — CLONAZEPAM 1 MG PO TABS: ORAL_TABLET | ORAL | 0 refills | Status: DC

## 2017-11-11 MED ORDER — OMEPRAZOLE 40 MG PO CPDR: DELAYED_RELEASE_CAPSULE | ORAL | 3 refills | Status: AC

## 2017-11-11 MED ORDER — LAMOTRIGINE 25 MG PO TABS: 25.0000 mg | ORAL_TABLET | Freq: Every day | ORAL | 1 refills | Status: DC

## 2017-11-11 NOTE — Patient Instructions (Addendum)
We have sent Lamotrigine (Lamictal) to your pharmacy. Take 1 tab (25mg ) every day in the morning. If you notice any sores develop in your mouth, please call immediately.   Please follow-up 6 in weeks.   Work on using the Clonazepam sparingly. Try to use 1/2 tablet or just taking 1 tablet once daily.  It has been a pleasure seeing you today. Tamara ApleyJoshua M Pascuala Klutts, RN, Adult-Geriatric Nurse Practitioner Student and Mayra ReelKate Clark, AGNP  Lamotrigine tablets What is this medicine? LAMOTRIGINE (la MOE Patrecia Pacetri jeen) is used to control seizures in adults and children with epilepsy and Lennox-Gastaut syndrome. It is also used in adults to treat bipolar disorder. This medicine may be used for other purposes; ask your health care provider or pharmacist if you have questions. COMMON BRAND NAME(S): Lamictal What should I tell my health care provider before I take this medicine? They need to know if you have any of these conditions: -a history of depression or bipolar disorder -aseptic meningitis during prior use of lamotrigine -folate deficiency -kidney disease -liver disease -suicidal thoughts, plans, or attempt; a previous suicide attempt by you or a family member -an unusual or allergic reaction to lamotrigine or other seizure medications, other medicines, foods, dyes, or preservatives -pregnant or trying to get pregnant -breast-feeding How should I use this medicine? Take this medicine by mouth with a glass of water. Follow the directions on the prescription label. Do not chew these tablets. If this medicine upsets your stomach, take it with food or milk. Take your doses at regular intervals. Do not take your medicine more often than directed. A special MedGuide will be given to you by the pharmacist with each new prescription and refill. Be sure to read this information carefully each time. Talk to your pediatrician regarding the use of this medicine in children. While this drug may be prescribed for  children as young as 2 years for selected conditions, precautions do apply. Overdosage: If you think you have taken too much of this medicine contact a poison control center or emergency room at once. NOTE: This medicine is only for you. Do not share this medicine with others. What if I miss a dose? If you miss a dose, take it as soon as you can. If it is almost time for your next dose, take only that dose. Do not take double or extra doses. What may interact with this medicine? -carbamazepine -female hormones, including contraceptive or birth control pills -methotrexate -phenobarbital -phenytoin -primidone -pyrimethamine -rifampin -trimethoprim -valproic acid This list may not describe all possible interactions. Give your health care provider a list of all the medicines, herbs, non-prescription drugs, or dietary supplements you use. Also tell them if you smoke, drink alcohol, or use illegal drugs. Some items may interact with your medicine. What should I watch for while using this medicine? Visit your doctor or health care professional for regular checks on your progress. If you take this medicine for seizures, wear a Medic Alert bracelet or necklace. Carry an identification card with information about your condition, medicines, and doctor or health care professional. It is important to take this medicine exactly as directed. When first starting treatment, your dose will need to be adjusted slowly. It may take weeks or months before your dose is stable. You should contact your doctor or health care professional if your seizures get worse or if you have any new types of seizures. Do not stop taking this medicine unless instructed by your doctor or health care professional. Stopping  your medicine suddenly can increase your seizures or their severity. Contact your doctor or health care professional right away if you develop a rash while taking this medicine. Rashes may be very severe and sometimes  require treatment in the hospital. Deaths from rashes have occurred. Serious rashes occur more often in children than adults taking this medicine. It is more common for these serious rashes to occur during the first 2 months of treatment, but a rash can occur at any time. You may get drowsy, dizzy, or have blurred vision. Do not drive, use machinery, or do anything that needs mental alertness until you know how this medicine affects you. To reduce dizzy or fainting spells, do not sit or stand up quickly, especially if you are an older patient. Alcohol can increase drowsiness and dizziness. Avoid alcoholic drinks. If you are taking this medicine for bipolar disorder, it is important to report any changes in your mood to your doctor or health care professional. If your condition gets worse, you get mentally depressed, feel very hyperactive or manic, have difficulty sleeping, or have thoughts of hurting yourself or committing suicide, you need to get help from your health care professional right away. If you are a caregiver for someone taking this medicine for bipolar disorder, you should also report these behavioral changes right away. The use of this medicine may increase the chance of suicidal thoughts or actions. Pay special attention to how you are responding while on this medicine. Your mouth may get dry. Chewing sugarless gum or sucking hard candy, and drinking plenty of water may help. Contact your doctor if the problem does not go away or is severe. Women who become pregnant while using this medicine may enroll in the [place] American Antiepileptic Drug Pregnancy Registry by calling [phone]. This registry collects information about the safety of antiepileptic drug use during pregnancy. What side effects may I notice from receiving this medicine? Side effects that you should report to your doctor or health care professional as soon as possible: -allergic reactions like skin rash, itching or hives,  swelling of the face, lips, or tongue -blurred or double vision -difficulty walking or controlling muscle movements -fever -headache, stiff neck, and sensitivity to light -painful sores in the mouth, eyes, or nose -redness, blistering, peeling or loosening of the skin, including inside the mouth -severe muscle pain -swollen lymph glands -uncontrollable eye movements -unusual bruising or bleeding -unusually weak or tired -vomiting -worsening of mood, thoughts or actions of suicide or dying -yellowing of the eyes or skin Side effects that usually do not require medical attention (report to your doctor or health care professional if they continue or are bothersome): -diarrhea or constipation -difficulty sleeping -nausea -tremors This list may not describe all possible side effects. Call your doctor for medical advice about side effects. You may report side effects to FDA at 1-800-FDA-1088. Where should I keep my medicine? Keep out of reach of children. Store at room temperature between 15 and 30 degrees C (59 and 86 degrees F). Throw away any unused medicine after the expiration date. NOTE: This sheet is a summary. It may not cover all possible information. If you have questions about this medicine, talk to your doctor, pharmacist, or health care provider.  2018 Elsevier/Gold Standard (2015-08-30 09:29:40)

## 2017-11-11 NOTE — Assessment & Plan Note (Signed)
Stable on omeprazole, refill provided today.

## 2017-11-11 NOTE — Progress Notes (Signed)
Subjective:    Patient ID: Tamara Robinson, female    DOB: 20-Jan-1971, 47 y.o.   MRN: [id number]  HPI Tamara Robinson is a 47 y.o. female who presents today for a follow-up for her Anxiety. She has constantly been feeling anxious, shaking, and sweating. Is currently taking her Klonopin BID and says it helps but doesn't fix it completely. She is also trying to stop smoking and expresses desire to wean off klonopin. She does not see any mental health professional or therapist due to not being able to afford it.   Reports trying and failing multiple therapies throughout the years.  GAD 7 : Generalized Anxiety Score 11/11/2017  Nervous, Anxious, on Edge 3  Control/stop worrying 3  Worry too much - different things 3  Trouble relaxing 3  Restless 3  Easily annoyed or irritable 0  Afraid - awful might happen 0  Total GAD 7 Score 15      Review of Systems  Constitutional: Negative for activity change, fatigue and unexpected weight change.  Respiratory: Negative for chest tightness and shortness of breath.   Cardiovascular: Negative for chest pain.  Psychiatric/Behavioral: Negative for agitation, behavioral problems and decreased concentration. The patient is nervous/anxious.         Past Medical History:  Diagnosis Date  . Allergy   . Asthma   . Frequent headaches   . GAD (generalized anxiety disorder)   . GERD (gastroesophageal reflux disease)   . Hypertension    Past Surgical History:  Procedure Laterality Date  . ABDOMINAL HYSTERECTOMY  2006   Social History   Socioeconomic History  . Marital status: Married    Spouse name: Not on file  . Number of children: Not on file  . Years of education: Not on file  . Highest education level: Not on file  Occupational History  . Not on file  Social Needs  . Financial resource strain: Not on file  . Food insecurity:    Worry: Not on file    Inability: Not on file  . Transportation needs:    Medical: Not on file    Non-medical: Not  on file  Tobacco Use  . Smoking status: Current Every Day Smoker    Packs/day: 1.00  . Smokeless tobacco: Never Used  Substance and Sexual Activity  . Alcohol use: Yes    Comment: weeky  . Drug use: Not on file  . Sexual activity: Not on file  Lifestyle  . Physical activity:    Days per week: Not on file    Minutes per session: Not on file  . Stress: Not on file  Relationships  . Social connections:    Talks on phone: Not on file    Gets together: Not on file    Attends religious service: Not on file    Active member of club or organization: Not on file    Attends meetings of clubs or organizations: Not on file    Relationship status: Not on file  . Intimate partner violence:    Fear of current or ex partner: Not on file    Emotionally abused: Not on file    Physically abused: Not on file    Forced sexual activity: Not on file  Other Topics Concern  . Not on file  Social History Narrative  . Not on file   No family history on file. Current Outpatient Medications on File Prior to Visit  Medication Sig Dispense Refill  . albuterol (VENTOLIN HFA) 108 (  90 Base) MCG/ACT inhaler Inhale 2 puffs into the lungs every 6 (six) hours as needed for wheezing or shortness of breath. 1 Inhaler 0  . atenolol (TENORMIN) 25 MG tablet TAKE 1 TABLET BY MOUTH EVERY DAY 90 tablet 1  . clonazePAM (KLONOPIN) 1 MG tablet TAKE 1 TABLET TWICE A DAY AS NEEDED FOR ANXIETY (USE SPARINGLY) 180 tablet 0  . omeprazole (PRILOSEC) 40 MG capsule TAKE ONE CAPSULE BY MOUTH EVERY DAY FOR30 DAYS 90 capsule 3  . PULMICORT FLEXHALER 180 MCG/ACT inhaler INHALE 1 PUFF INTO THE LUNGS 2 (TWO) TIMES DAILY. 1 each 5  . triamcinolone ointment (KENALOG) 0.5 % Apply 1 application topically 2 (two) times daily. 30 g 0   No current facility-administered medications on file prior to visit.     Objective:   Physical Exam  Constitutional: She is oriented to person, place, and time. She appears well-nourished. No distress.    Neurological: She is oriented to person, place, and time.  Skin: Skin is warm and dry.  Psychiatric: Her speech is normal and behavior is normal. Her mood appears anxious. Her affect is not inappropriate. Cognition and memory are normal. She does not express impulsivity or inappropriate judgment.    BP 122/78   Pulse 68   Temp 98.1 F (36.7 C) (Oral)   Ht 5' 3.75" (1.619 m)   Wt 208 lb 12 oz (94.7 kg)   SpO2 96%   BMI 36.11 kg/m      Assessment & Plan:   1. GAD (generalized anxiety disorder) - Follow-up in 4-6 weeks - lamoTRIgine (LAMICTAL) 25 MG tablet; Take 1 tablet (25 mg total) by mouth daily.  Dispense: 30 tablet; Refill: 1 - clonazePAM (KLONOPIN) 1 MG tablet; TAKE 1 TABLET TWICE A DAY AS NEEDED FOR ANXIETY (USE SPARINGLY)  Dispense: 60 tablet; Refill: 0  2. Gastroesophageal reflux disease, esophagitis presence not specified - omeprazole (PRILOSEC) 40 MG capsule; TAKE ONE CAPSULE BY MOUTH EVERY DAY  Dispense: 90 capsule; Refill: 3  Rebecka ApleyJoshua M Kiyonna Tortorelli, RN, Adult-Geriatric Nurse Practitioner Student

## 2017-11-11 NOTE — Assessment & Plan Note (Signed)
Long discussion about importance of coming off of Clonazepam, she verbalized understanding. She will start to work on halving her clonazepam tablets if possible.  Given that she's failed numerous other antidepressants/anti-anxiety medications in the past, will trial Lamictal 25 mg. Last hepatic and renal function normal. Discussed potential side effects, she verbalized understanding.    Will see her back in the office in 6 weeks, repeat CMP at that time.

## 2017-11-11 NOTE — Progress Notes (Signed)
Subjective:    Patient ID: Tamara Robinson, female    DOB: 1970/11/04, 47 y.o.   MRN: [id number]  HPI  Tamara Robinson is a 47 year old female who presents today for medication refill.  She is currently managed on Clonazepam 1 mg twice daily, everyday, for which she's taken for years. She continues to feel symptoms of feeling shaky, nervous, sweating, heart racing despite use of her clonazepam. She's currently trying to work on cutting down on cigarette use and states that she cannot come off of her clonazepam right now. She's failed numerous medications in the past including SSRI's, SNRI's, Wellbutrin. She is open to coming off of her clonazepam and is which has been the goal, she is open to any other alternative for anxiety treatment.  GAD 7 score of 15 today. She denies SI/HI.   Review of Systems  Respiratory: Negative for shortness of breath.   Cardiovascular: Negative for chest pain.  Psychiatric/Behavioral: Positive for sleep disturbance. Negative for suicidal ideas. The patient is nervous/anxious.        Past Medical History:  Diagnosis Date  . Allergy   . Asthma   . Frequent headaches   . GAD (generalized anxiety disorder)   . GERD (gastroesophageal reflux disease)   . Hypertension      Social History   Socioeconomic History  . Marital status: Married    Spouse name: Not on file  . Number of children: Not on file  . Years of education: Not on file  . Highest education level: Not on file  Occupational History  . Not on file  Social Needs  . Financial resource strain: Not on file  . Food insecurity:    Worry: Not on file    Inability: Not on file  . Transportation needs:    Medical: Not on file    Non-medical: Not on file  Tobacco Use  . Smoking status: Current Every Day Smoker    Packs/day: 1.00  . Smokeless tobacco: Never Used  Substance and Sexual Activity  . Alcohol use: Yes    Comment: weeky  . Drug use: Not on file  . Sexual activity: Not on file  Lifestyle   . Physical activity:    Days per week: Not on file    Minutes per session: Not on file  . Stress: Not on file  Relationships  . Social connections:    Talks on phone: Not on file    Gets together: Not on file    Attends religious service: Not on file    Active member of club or organization: Not on file    Attends meetings of clubs or organizations: Not on file    Relationship status: Not on file  . Intimate partner violence:    Fear of current or ex partner: Not on file    Emotionally abused: Not on file    Physically abused: Not on file    Forced sexual activity: Not on file  Other Topics Concern  . Not on file  Social History Narrative  . Not on file    Past Surgical History:  Procedure Laterality Date  . ABDOMINAL HYSTERECTOMY  2006    No family history on file.  No Known Allergies  Current Outpatient Medications on File Prior to Visit  Medication Sig Dispense Refill  . albuterol (VENTOLIN HFA) 108 (90 Base) MCG/ACT inhaler Inhale 2 puffs into the lungs every 6 (six) hours as needed for wheezing or shortness of breath. 1 Inhaler  0  . atenolol (TENORMIN) 25 MG tablet TAKE 1 TABLET BY MOUTH EVERY DAY 90 tablet 1  . PULMICORT FLEXHALER 180 MCG/ACT inhaler INHALE 1 PUFF INTO THE LUNGS 2 (TWO) TIMES DAILY. 1 each 5  . triamcinolone ointment (KENALOG) 0.5 % Apply 1 application topically 2 (two) times daily. 30 g 0   No current facility-administered medications on file prior to visit.     BP 122/78   Pulse 68   Temp 98.1 F (36.7 C) (Oral)   Ht 5' 3.75" (1.619 m)   Wt 208 lb 12 oz (94.7 kg)   SpO2 96%   BMI 36.11 kg/m    Objective:   Physical Exam  Constitutional: She appears well-nourished.  Neck: Neck supple.  Cardiovascular: Normal rate and regular rhythm.  Pulmonary/Chest: Effort normal and breath sounds normal.  Skin: Skin is warm and dry.  Psychiatric: She has a normal mood and affect.          Assessment & Plan:

## 2017-12-03 ENCOUNTER — Telehealth: Payer: Self-pay | Admitting: Primary Care

## 2017-12-03 NOTE — Telephone Encounter (Signed)
Pt cancelled appt on 5/8 for med follow up due to feeling better and having negative side effects from Lamictal. She has stopped taking medicine currently. States she experienced what she calls a "halo headache" where it started in forehead and wrapped around back of neck. She was unable to move her head and experienced dizziness. She states she tried taking 2 motrin and it wouldn't help with headache.

## 2017-12-03 NOTE — Telephone Encounter (Signed)
Noted  

## 2017-12-14 ENCOUNTER — Telehealth: Payer: Self-pay | Admitting: Primary Care

## 2017-12-14 ENCOUNTER — Other Ambulatory Visit: Payer: Self-pay | Admitting: Primary Care

## 2017-12-14 DIAGNOSIS — F411 Generalized anxiety disorder: Secondary | ICD-10-CM

## 2017-12-14 NOTE — Telephone Encounter (Signed)
Has she started reducing the use of her clonazepam? Would recommend 1/2 tablet once daily and 1 tablet once daily for 2-3 weeks, then 1/2 tablet twice daily for another month.  Since she didn't do well on Lamictal and if she can't come down on this clonazepam then I'll have to send her to psychiatry.

## 2017-12-14 NOTE — Telephone Encounter (Signed)
Electronic refill request Last office visit 11/11/17 Last refill 11/11/17 #60

## 2017-12-15 NOTE — Telephone Encounter (Signed)
Spoken to patient. She stated that she did not realized that she was supposed to reducing the clonazpam. Patient stated that the last two appointment she thought was just about help quit smoking. Patient stated that she cannot reduce it right due to trying to quit smoking. Patient stated that she cannot do both right now. Patient is completely out and ask if [facility] can refill.  Patient didn't even respond to sending her to psychiatry.

## 2017-12-15 NOTE — Telephone Encounter (Signed)
Please notify patient that the goal, as discussed during her last visit, is to eventually wean her off. We will do this slowly over time. Is she agreeable to see psychiatry, I highly recommend it as I will be unable to continue prescribing her clonazepam in the future. They can help control her symptoms without having to use Clonazepam. Let me know and I'll put in a referral.

## 2017-12-16 ENCOUNTER — Ambulatory Visit: Payer: Self-pay | Admitting: Primary Care

## 2017-12-16 NOTE — Telephone Encounter (Signed)
Message left for patient to return my call.  

## 2017-12-24 ENCOUNTER — Encounter: Payer: Self-pay | Admitting: Primary Care

## 2017-12-24 NOTE — Telephone Encounter (Signed)
Message left for patient to return my call on 12/21/2017 and 12/24/2017  Also send a message through MyChart

## 2017-12-25 NOTE — Telephone Encounter (Signed)
I'm going to leave this for PCP

## 2017-12-25 NOTE — Telephone Encounter (Signed)
Patient responded through MyChart

## 2017-12-28 NOTE — Telephone Encounter (Signed)
Medication was refilled on 12/15/17. See my chart messages.

## 2018-01-02 ENCOUNTER — Encounter: Payer: Self-pay | Admitting: Primary Care

## 2018-01-02 DIAGNOSIS — K121 Other forms of stomatitis: Secondary | ICD-10-CM

## 2018-01-05 MED ORDER — TRIAMCINOLONE ACETONIDE 0.1 % MT PSTE: 1.0000 "application " | PASTE | Freq: Two times a day (BID) | OROMUCOSAL | 0 refills | Status: DC

## 2018-01-14 ENCOUNTER — Other Ambulatory Visit: Payer: Self-pay | Admitting: Primary Care

## 2018-01-14 DIAGNOSIS — F411 Generalized anxiety disorder: Secondary | ICD-10-CM

## 2018-01-14 NOTE — Telephone Encounter (Signed)
No suspicious activity noted on PMP aware, patient is working on weaning off. Refill sent to pharmacy.

## 2018-01-14 NOTE — Telephone Encounter (Signed)
Ok to refill? Electronically refill request for clonazePAM (KLONOPIN) 1 MG tablet  Last prescribed 12/15/2017  Last seen on 11/11/2017

## 2018-01-22 ENCOUNTER — Encounter: Payer: Self-pay | Admitting: Primary Care

## 2018-02-10 ENCOUNTER — Other Ambulatory Visit: Payer: Self-pay | Admitting: Primary Care

## 2018-02-10 DIAGNOSIS — F411 Generalized anxiety disorder: Secondary | ICD-10-CM

## 2018-02-10 MED ORDER — CLONAZEPAM 1 MG PO TABS: ORAL_TABLET | ORAL | 0 refills | Status: DC

## 2018-02-10 NOTE — Telephone Encounter (Signed)
Name of Medication: clonazePAM (KLONOPIN) 1 MG tablet  [facility]   Last Fill or Written Date and Quantity: 01/14/2018 #45  Last Office Visit and Type: follow up 11/11/2017  Next Office Visit and Type:   Last Controlled Substance Agreement Date: 05/20/2016  Last UDS: 05/20/2016

## 2018-02-22 ENCOUNTER — Encounter: Payer: Self-pay | Admitting: Primary Care

## 2018-04-23 ENCOUNTER — Emergency Department (HOSPITAL_COMMUNITY): Admission: EM | Admit: 2018-04-23 | Discharge: 2018-04-23 | Discharge status: Against Medical Advice | Payer: Self-pay

## 2019-02-06 ENCOUNTER — Encounter (HOSPITAL_COMMUNITY): Payer: Self-pay | Admitting: *Deleted

## 2019-02-06 ENCOUNTER — Other Ambulatory Visit: Payer: Self-pay

## 2019-02-06 ENCOUNTER — Emergency Department (HOSPITAL_COMMUNITY)
Admission: EM | Admit: 2019-02-06 | Discharge: 2019-02-06 | Disposition: A | Discharge status: Home / Self Care | Payer: Self-pay | Attending: Emergency Medicine | Admitting: Emergency Medicine

## 2019-02-06 DIAGNOSIS — R220 Localized swelling, mass and lump, head: Secondary | ICD-10-CM | POA: Insufficient documentation

## 2019-02-06 DIAGNOSIS — F17210 Nicotine dependence, cigarettes, uncomplicated: Secondary | ICD-10-CM | POA: Insufficient documentation

## 2019-02-06 DIAGNOSIS — I10 Essential (primary) hypertension: Secondary | ICD-10-CM | POA: Insufficient documentation

## 2019-02-06 DIAGNOSIS — J45909 Unspecified asthma, uncomplicated: Secondary | ICD-10-CM | POA: Insufficient documentation

## 2019-02-06 DIAGNOSIS — K0889 Other specified disorders of teeth and supporting structures: Secondary | ICD-10-CM | POA: Insufficient documentation

## 2019-02-06 DIAGNOSIS — R7303 Prediabetes: Secondary | ICD-10-CM | POA: Insufficient documentation

## 2019-02-06 MED ORDER — ONDANSETRON 4 MG PO TBDP
4.0000 mg | ORAL_TABLET | Freq: Once | ORAL | Status: AC
  Administered 2019-02-06: 13:00:00 4 mg via ORAL
  Filled 2019-02-06: qty 1

## 2019-02-06 MED ORDER — CLINDAMYCIN HCL 150 MG PO CAPS
300.0000 mg | ORAL_CAPSULE | Freq: Once | ORAL | Status: AC
  Administered 2019-02-06: 13:00:00 300 mg via ORAL
  Filled 2019-02-06: qty 2

## 2019-02-06 MED ORDER — ONDANSETRON 4 MG PO TBDP: 4.0000 mg | ORAL_TABLET | Freq: Three times a day (TID) | ORAL | 0 refills | Status: DC | PRN

## 2019-02-06 MED ORDER — HYDROCODONE-ACETAMINOPHEN 5-325 MG PO TABS: 2.0000 | ORAL_TABLET | ORAL | 0 refills | Status: DC | PRN

## 2019-02-06 MED ORDER — ONDANSETRON 4 MG PO TBDP
4.0000 mg | ORAL_TABLET | Freq: Three times a day (TID) | ORAL | 0 refills | Status: AC | PRN
Start: 2019-02-06 — End: ?

## 2019-02-06 MED ORDER — CLINDAMYCIN HCL 300 MG PO CAPS: 300.0000 mg | ORAL_CAPSULE | Freq: Three times a day (TID) | ORAL | 0 refills | Status: AC

## 2019-02-06 MED ORDER — HYDROCODONE-ACETAMINOPHEN 5-325 MG PO TABS
1.0000 | ORAL_TABLET | Freq: Once | ORAL | Status: AC
  Administered 2019-02-06: 1 via ORAL
  Filled 2019-02-06: qty 1

## 2019-02-06 NOTE — ED Triage Notes (Signed)
PT reports tooth is located on Rt upper side . Pt reports a root cannel in same tooth . For 2 months. Pt has done tele med x2  And has used 2 rounds of Anti-BX

## 2019-02-06 NOTE — ED Provider Notes (Signed)
[facility]   CSN: [id number] Arrival date & time: 02/06/19  1218   History   Chief Complaint Chief Complaint  Patient presents with   Dental Pain    HPI Tamara Robinson is a 48 y.o. female with past medical history significant for asthma, allergy, headaches, GERD, hypertension who presents for evaluation of dental pain.  Patient states she has had ongoing dental pain x3 months.  Was assessed 3 times via telehealth and given amoxicillin.  She was last on amoxicillin 1 month ago.  Patient states she feels like she has developed right-sided facial swelling since she completed her antibiotics 1 month ago.  Pain located to her right upper molar.  Patient states she does have missing teeth to this area.  Does not have dental insurance.  States facial swelling has not gotten worse or improved since her last round of antibiotics.  She was told to follow-up with dentistry however she has not.  Rates her current pain a 7/10.  Denies radiation of pain.  She denies drooling, dysphasia or trismus, or, chills, nausea, vomiting, oral swelling, neck pain, neck stiffness, difficulty tolerating p.o. intake, cough, congestion, chest pain or shortness of breath.  Has been taking Tylenol at home without relief of her symptoms.  History obtained from patient and past medical records.  No interpreter is used.     HPI  Past Medical History:  Diagnosis Date   Allergy    Asthma    Frequent headaches    GAD (generalized anxiety disorder)    GERD (gastroesophageal reflux disease)    Hypertension     Patient Active Problem List   Diagnosis Date Noted   Tobacco abuse 02/04/2017   Acute left-sided low back pain with left-sided sciatica 02/04/2017   Preventative health care 08/19/2016   Prediabetes 08/19/2016   Essential hypertension 05/20/2016   Moderate persistent asthma without complication 05/20/2016   GAD (generalized anxiety disorder)  05/20/2016   Vitamin B 12 deficiency 05/20/2016   GERD (gastroesophageal reflux disease) 05/20/2016    Past Surgical History:  Procedure Laterality Date   ABDOMINAL HYSTERECTOMY  2006     OB History   No obstetric history on file.      Home Medications    Prior to Admission medications   Medication Sig Start Date End Date Taking? Authorizing Provider  albuterol (VENTOLIN HFA) 108 (90 Base) MCG/ACT inhaler Inhale 2 puffs into the lungs every 6 (six) hours as needed for wheezing or shortness of breath. 07/29/17   Doreene Nestlark, Katherine K, NP  atenolol (TENORMIN) 25 MG tablet TAKE 1 TABLET BY MOUTH EVERY DAY 07/24/17   Doreene Nestlark, Katherine K, NP  clindamycin (CLEOCIN) 300 MG capsule Take 1 capsule (300 mg total) by mouth 3 (three) times daily for 5 days. 02/06/19 02/11/19  Nishita Isaacks A, PA-C  clonazePAM (KLONOPIN) 1 MG tablet Take 1/2 tablet by mouth every morning and 1 tablet by mouth every evening as needed for anxiety. 02/10/18   Lorre MunroeBaity, Regina W, NP  HYDROcodone-acetaminophen (NORCO/VICODIN) 5-325 MG tablet Take 2 tablets by mouth every 4 (four) hours as needed. 02/06/19   Marteze Vecchio A, PA-C  omeprazole (PRILOSEC) 40 MG capsule TAKE ONE CAPSULE BY MOUTH EVERY DAY 11/11/17   Doreene Nestlark, Katherine K, NP  ondansetron (ZOFRAN ODT) 4 MG disintegrating tablet Take 1 tablet (4 mg total) by mouth every 8 (eight) hours as needed for nausea or vomiting. 02/06/19   Lakeia Bradshaw A, PA-C  PULMICORT FLEXHALER 180 MCG/ACT  inhaler INHALE 1 PUFF INTO THE LUNGS 2 (TWO) TIMES DAILY. 07/13/17   Doreene Nestlark, Katherine K, NP  triamcinolone (KENALOG) 0.1 % paste Use as directed 1 application in the mouth or throat 2 (two) times daily. 01/05/18   Doreene Nestlark, Katherine K, NP  triamcinolone ointment (KENALOG) 0.5 % Apply 1 application topically 2 (two) times daily. 07/29/17   Doreene Nestlark, Katherine K, NP    Family History History reviewed. No pertinent family history.  Social History Social History   Tobacco Use   Smoking  status: Current Every Day Smoker    Packs/day: 1.00   Smokeless tobacco: Never Used  Substance Use Topics   Alcohol use: Yes    Comment: weeky   Drug use: Not on file    Allergies   Patient has no known allergies.   Review of Systems Review of Systems  Constitutional: Negative.   HENT: Positive for dental problem and facial swelling. Negative for congestion, drooling, ear discharge, ear pain, hearing loss, mouth sores, nosebleeds, postnasal drip, rhinorrhea, sinus pressure, sinus pain, sneezing, sore throat, tinnitus, trouble swallowing and voice change.   Respiratory: Negative.   Cardiovascular: Negative.   Gastrointestinal: Negative.   Genitourinary: Negative.   Musculoskeletal: Negative.   Skin: Negative.   Neurological: Negative.   All other systems reviewed and are negative.    Physical Exam Updated Vital Signs BP 137/87 (BP Location: Right Arm)    Pulse 88    Temp 98.6 F (37 C) (Oral)    Resp 19    SpO2 98%   Physical Exam Vitals signs and nursing note reviewed.  Constitutional:      General: She is not in acute distress.    Appearance: She is well-developed. She is not ill-appearing, toxic-appearing or diaphoretic.  HENT:     Head: Normocephalic and atraumatic.     Jaw: There is normal jaw occlusion.     Comments: No evidence of facial swelling.  No induration, fluctuance, warmth or erythema to face or neck.    Nose: Nose normal.     Right Sinus: No maxillary sinus tenderness or frontal sinus tenderness.     Left Sinus: No maxillary sinus tenderness or frontal sinus tenderness.     Mouth/Throat:     Lips: Pink.     Mouth: Mucous membranes are moist.     Dentition: Abnormal dentition. Dental tenderness present. No gingival swelling, dental caries, dental abscesses or gum lesions.     Pharynx: Oropharynx is clear.     Comments: Oropharynx clear.  Mucous membranes moist.  Uvula midline without deviation.  No evidence of PTA RPA.  No intraoral swelling.   Gingiva without erythema or swelling.  She does have missing teeth to her right upper molars.  No evidence of drainable periapical abscess.  Tenderness to gingiva surrounding missing teeth.  Patient with multiple dental caries. Eyes:     Pupils: Pupils are equal, round, and reactive to light.  Neck:     Musculoskeletal: Full passive range of motion without pain and normal range of motion.     Comments: No neck stiffness or neck rigidity.  Phonation normal.  No JVD. Cardiovascular:     Rate and Rhythm: Normal rate.     Pulses: Normal pulses.     Heart sounds: Normal heart sounds.  Pulmonary:     Effort: Pulmonary effort is normal. No respiratory distress.     Breath sounds: Normal breath sounds and air entry.  Abdominal:     General: There is no  distension.     Palpations: Abdomen is soft.     Tenderness: There is no abdominal tenderness.  Musculoskeletal: Normal range of motion.     Comments: Moves all 4 extremities without difficulty.  Skin:    General: Skin is warm and dry.     Comments: Brisk capillary refill.  No rashes or lesions.  No edema, erythema, warmth, fluctuance or induration.  Neurological:     Mental Status: She is alert.      ED Treatments / Results  Labs (all labs ordered are listed, but only abnormal results are displayed) Labs Reviewed - No data to display  EKG None  Radiology No results found.  Procedures Procedures (including critical care time)  Medications Ordered in ED Medications  clindamycin (CLEOCIN) capsule 300 mg (300 mg Oral Given 02/06/19 1242)  HYDROcodone-acetaminophen (NORCO/VICODIN) 5-325 MG per tablet 1 tablet (1 tablet Oral Given 02/06/19 1242)  ondansetron (ZOFRAN-ODT) disintegrating tablet 4 mg (4 mg Oral Given 02/06/19 1242)    Initial Impression / Assessment and Plan / ED Course  I have reviewed the triage vital signs and the nursing notes.  Pertinent labs & imaging results that were available during my care of the patient were  reviewed by me and considered in my medical decision making (see chart for details).  48 year old female appears otherwise well presents for evaluation of dental pain. Afebrile, nonseptic, non-ill-appearing.  Patient with dental pain x3 months.  Originally started with left upper dental pain however is now located to right upper jaw near her upper molars.  Patient states she feels like she has gingival swelling without any drainage.  Has been on 2 courses of amoxicillin over the last 3 months.  Last round was 1 month ago.  Patient with poor dentition with multiple missing teeth.  She has dental tenderness to her right upper molars where they are missing.  She has no gingival erythema or fluctuance.  No evidence of drainable abscess.  She has no oral lesions or oral swelling. Her face is without swelling, redness, warmth, induration or fluctuance.  No neck stiffness neck rigidity.  No evidence of PTA, RPA, sublingual area soft.  No submandibular swelling.  No evidence Ludwig's angina.  No drooling, dysphasia or trismus.  Phonation normal.  Patient states she feels like her pain is "deeper in my upper jaw."  Discussed with patient possible CT scan and labs to assess for deep space infection given length of dental pain.  Discussed risk versus benefit.  Patient refuses CT scan and labs at this time.  Discussed with patient we cannot rule out deep space infection which could possibly require surgical management. She voices understanding of this however declines any imaging or labs at this time.  Patient does not appear to have any deep space infection on exam.  Her face is without swelling, fluctuance or cellulitis.  Her neck has no stiffness or neck rigidity to indicate deeper neck infection.  Her oral exam is without any significant findings.  She appears overall well she does not have any tachycardia, tachypnea or hypoxia.  No evidence of sirs or sepsis.  She is tolerating p.o. intake in ED without difficulty.  Her  airway is clear without stridor.  She has no neck stiffness or neck rigidity.  Will DC home with short course of pain neds as well as clindamycin as she has previously been on amoxicillin. First dose given in ED. Discussed with patient she is to follow-up with dentistry as soon as possible.  The patient has been appropriately medically screened and/or stabilized in the ED. I have low suspicion for any other emergent medical condition which would require further screening, evaluation or treatment in the ED or require inpatient management.  Patient is hemodynamically stable and in no acute distress.  Patient able to ambulate in department prior to ED.  Evaluation does not show acute pathology that would require ongoing or additional emergent interventions while in the emergency department or further inpatient treatment.  I have discussed the diagnosis with the patient and answered all questions.  Pain is been managed while in the emergency department and patient has no further complaints prior to discharge.  Patient is comfortable with plan discussed in room and is stable for discharge at this time.  I have discussed strict return precautions for returning to the emergency department.  Patient was encouraged to follow-up with PCP/specialist refer to at discharge.     Final Clinical Impressions(s) / ED Diagnoses   Final diagnoses:  Pain, dental    ED Discharge Orders         Ordered    clindamycin (CLEOCIN) 300 MG capsule  3 times daily     02/06/19 1324    ondansetron (ZOFRAN ODT) 4 MG disintegrating tablet  Every 8 hours PRN     02/06/19 1324    HYDROcodone-acetaminophen (NORCO/VICODIN) 5-325 MG tablet  Every 4 hours PRN     02/06/19 1324           Brandun Pinn A, PA-C 02/06/19 1337    Little, Ambrose Finlandachel Morgan, MD 02/07/19 1241

## 2019-02-06 NOTE — ED Notes (Signed)
Patient verbalizes understanding of discharge instructions . Opportunity for questions and answers were provided . Armband removed by staff ,Pt discharged from ED. W/C  offered at D/C  and Declined W/C at D/C and was escorted to lobby by RN.  

## 2019-02-06 NOTE — Discharge Instructions (Signed)
Follow up with dentistry  Return to the ED for any new or worsening symptoms

## 2019-03-18 ENCOUNTER — Encounter (HOSPITAL_COMMUNITY): Payer: Self-pay | Admitting: Emergency Medicine

## 2019-03-18 ENCOUNTER — Other Ambulatory Visit: Payer: Self-pay

## 2019-03-18 ENCOUNTER — Emergency Department (HOSPITAL_COMMUNITY)
Admission: EM | Admit: 2019-03-18 | Discharge: 2019-03-18 | Disposition: A | Discharge status: Home / Self Care | Payer: Self-pay | Attending: Emergency Medicine | Admitting: Emergency Medicine

## 2019-03-18 DIAGNOSIS — I10 Essential (primary) hypertension: Secondary | ICD-10-CM | POA: Insufficient documentation

## 2019-03-18 DIAGNOSIS — F29 Unspecified psychosis not due to a substance or known physiological condition: Secondary | ICD-10-CM

## 2019-03-18 DIAGNOSIS — R45851 Suicidal ideations: Secondary | ICD-10-CM

## 2019-03-18 DIAGNOSIS — Z79899 Other long term (current) drug therapy: Secondary | ICD-10-CM | POA: Insufficient documentation

## 2019-03-18 DIAGNOSIS — J45909 Unspecified asthma, uncomplicated: Secondary | ICD-10-CM | POA: Insufficient documentation

## 2019-03-18 DIAGNOSIS — F17210 Nicotine dependence, cigarettes, uncomplicated: Secondary | ICD-10-CM | POA: Insufficient documentation

## 2019-03-18 DIAGNOSIS — Z20828 Contact with and (suspected) exposure to other viral communicable diseases: Secondary | ICD-10-CM | POA: Insufficient documentation

## 2019-03-18 DIAGNOSIS — F1994 Other psychoactive substance use, unspecified with psychoactive substance-induced mood disorder: Secondary | ICD-10-CM | POA: Insufficient documentation

## 2019-03-18 LAB — RAPID URINE DRUG SCREEN, HOSP PERFORMED
Amphetamines: NOT DETECTED
Barbiturates: NOT DETECTED
Benzodiazepines: NOT DETECTED
Cocaine: NOT DETECTED
Opiates: NOT DETECTED
Tetrahydrocannabinol: POSITIVE — AB

## 2019-03-18 LAB — COMPREHENSIVE METABOLIC PANEL
ALT: 29 U/L (ref 0–44)
AST: 35 U/L (ref 15–41)
Albumin: 5.2 g/dL — ABNORMAL HIGH (ref 3.5–5.0)
Alkaline Phosphatase: 80 U/L (ref 38–126)
Anion gap: 14 (ref 5–15)
BUN: 19 mg/dL (ref 6–20)
CO2: 23 mmol/L (ref 22–32)
Calcium: 10.4 mg/dL — ABNORMAL HIGH (ref 8.9–10.3)
Chloride: 100 mmol/L (ref 98–111)
Creatinine, Ser: 1 mg/dL (ref 0.44–1.00)
GFR calc Af Amer: >60 mL/min (ref >60)
GFR calc non Af Amer: >60 mL/min (ref >60)
Glucose, Bld: 135 mg/dL — ABNORMAL HIGH (ref 70–99)
Potassium: 4.5 mmol/L (ref 3.5–5.1)
Sodium: 137 mmol/L (ref 135–145)
Total Bilirubin: 0.6 mg/dL (ref 0.3–1.2)
Total Protein: 8.4 g/dL — ABNORMAL HIGH (ref 6.5–8.1)

## 2019-03-18 LAB — CBC WITH DIFFERENTIAL/PLATELET
Abs Immature Granulocytes: 0.09 10*3/uL — ABNORMAL HIGH (ref 0.00–0.07)
Basophils Absolute: 0.1 10*3/uL (ref 0.0–0.1)
Basophils Relative: 1 %
Eosinophils Absolute: 0 10*3/uL (ref 0.0–0.5)
Eosinophils Relative: 0 %
HCT: 48.2 % — ABNORMAL HIGH (ref 36.0–46.0)
Hemoglobin: 16 g/dL — ABNORMAL HIGH (ref 12.0–15.0)
Immature Granulocytes: 0 %
Lymphocytes Relative: 21 %
Lymphs Abs: 4.2 10*3/uL — ABNORMAL HIGH (ref 0.7–4.0)
MCH: 32 pg (ref 26.0–34.0)
MCHC: 33.2 g/dL (ref 30.0–36.0)
MCV: 96.4 fL (ref 80.0–100.0)
Monocytes Absolute: 1.4 10*3/uL — ABNORMAL HIGH (ref 0.1–1.0)
Monocytes Relative: 7 %
Neutro Abs: 14.4 10*3/uL — ABNORMAL HIGH (ref 1.7–7.7)
Neutrophils Relative %: 71 %
Platelets: 351 10*3/uL (ref 150–400)
RBC: 5 MIL/uL (ref 3.87–5.11)
RDW: 13.1 % (ref 11.5–15.5)
WBC: 20.3 10*3/uL — ABNORMAL HIGH (ref 4.0–10.5)
nRBC: 0 % (ref 0.0–0.2)

## 2019-03-18 LAB — PREGNANCY, URINE: Preg Test, Ur: NEGATIVE

## 2019-03-18 LAB — ACETAMINOPHEN LEVEL: Acetaminophen (Tylenol), Serum: <10 ug/mL — ABNORMAL LOW (ref 10–30)

## 2019-03-18 LAB — SARS CORONAVIRUS 2 BY RT PCR (HOSPITAL ORDER, PERFORMED IN ~~LOC~~ HOSPITAL LAB): SARS Coronavirus 2: NEGATIVE

## 2019-03-18 LAB — SALICYLATE LEVEL: Salicylate Lvl: <7 mg/dL (ref 2.8–30.0)

## 2019-03-18 LAB — ETHANOL: Alcohol, Ethyl (B): <10 mg/dL (ref <10)

## 2019-03-18 MED ORDER — ZIPRASIDONE MESYLATE 20 MG IM SOLR
20.0000 mg | Freq: Once | INTRAMUSCULAR | Status: AC
  Administered 2019-03-18: 11:00:00 20 mg via INTRAMUSCULAR
  Filled 2019-03-18: qty 20

## 2019-03-18 MED ORDER — ALUM & MAG HYDROXIDE-SIMETH 200-200-20 MG/5ML PO SUSP: 30.0000 mL | Freq: Four times a day (QID) | ORAL | Status: DC | PRN

## 2019-03-18 MED ORDER — NICOTINE 21 MG/24HR TD PT24
21.0000 mg | MEDICATED_PATCH | Freq: Every day | TRANSDERMAL | Status: DC
  Administered 2019-03-18: 21 mg via TRANSDERMAL
  Filled 2019-03-18: qty 1

## 2019-03-18 MED ORDER — ACETAMINOPHEN 325 MG PO TABS: 650.0000 mg | ORAL_TABLET | ORAL | Status: DC | PRN

## 2019-03-18 MED ORDER — BUDESONIDE 180 MCG/ACT IN AEPB: 2.0000 | INHALATION_SPRAY | Freq: Two times a day (BID) | RESPIRATORY_TRACT | Status: DC | PRN

## 2019-03-18 MED ORDER — STERILE WATER FOR INJECTION IJ SOLN
INTRAMUSCULAR | Status: AC
  Administered 2019-03-18: 1.3 mL
  Filled 2019-03-18: qty 10

## 2019-03-18 MED ORDER — ONDANSETRON HCL 4 MG PO TABS: 4.0000 mg | ORAL_TABLET | Freq: Three times a day (TID) | ORAL | Status: DC | PRN

## 2019-03-18 MED ORDER — ZOLPIDEM TARTRATE 5 MG PO TABS: 5.0000 mg | ORAL_TABLET | Freq: Every evening | ORAL | Status: DC | PRN

## 2019-03-18 MED ORDER — ATENOLOL 25 MG PO TABS
25.0000 mg | ORAL_TABLET | Freq: Every day | ORAL | Status: DC
  Administered 2019-03-18: 10:00:00 25 mg via ORAL
  Filled 2019-03-18: qty 1

## 2019-03-18 NOTE — BH Assessment (Addendum)
Assessment Note  Tamara Robinson is an 48 y.o. female that presents this date with IVC. Patient was found outside her home by law enforcement running around naked and became very combative, she had to be restrained by EMS. She reports using marijuana prior to arrival. Patient denies any S/I, H/I or AVH at the time of assessment and reported she used some "bad weed" prior to arrival. Patient reports ongoing marital issues and states she had a verbal altercation with her spouse earlier. Patient denies any prior attempts or gestures at self harm or history of inpatient hospitalizations associated with mental health. Patient denies any current mental health diagnosis or OP care. Patient reports daily Cannabis use for the last year stating she uses 1 gram or more with last use prior to arrival when patient reported she used a excessive amount (3 grams). She however denies SI or HI. She did report having hallucinations while using marijuana. No report of command hallucination. She denies any recent alcohol use or history of any other SA use. Patient's UDS is positive for THC. Patient is oriented x 4 at the time of assessment and speaks in a normal clear tone. Tamara Garret FNP also evlauted patient and noted, "patient denies suicidal/homicidal ideation, denies auditory/visual hallucinations. Patient stated she and her husband have been arguing and she is purposely doing things to make him mad. She stated she wants to divorce him. She does admit to smoking a lot of weed and has been hallucinating because of this. She stated she may have gotten some "bad weed." Patient does have a history in 2019 of weaning herself off of clonazepam, under supervision form her PCP. She could be utilizing marijuana to help with her anxiety. Her erratic behavior could be contributed to excessive marijuana use, especially if it has been laced with some other substance. Patient's husband took out an IVC, which appears to be based on a "history of past  behaviors." Patient is denying suicidal and homicidal ideations or a plan.  Patient should return to the emergency room or call 911 if her symptoms do not improve or worsen. Patient is psychiatrically cleared for discharge.     Diagnosis: Substance induced mood disorder  Past Medical History:  Past Medical History:  Diagnosis Date  . Allergy   . Asthma   . Frequent headaches   . GAD (generalized anxiety disorder)   . GERD (gastroesophageal reflux disease)   . Hypertension     Past Surgical History:  Procedure Laterality Date  . ABDOMINAL HYSTERECTOMY  2006    Family History: History reviewed. No pertinent family history.  Social History:  reports that she has been smoking. She has been smoking about 1.00 pack per day. She has never used smokeless tobacco. She reports current alcohol use. No history on file for drug.  Additional Social History:  Alcohol / Drug Use Pain Medications: See MAR Prescriptions: See MAR Over the Counter: See MAR History of alcohol / drug use?: Yes Longest period of sobriety (when/how long): Unknown(Denies) Withdrawal Symptoms: (denies) Substance #1 Name of Substance 1: Cannabis 1 - Age of First Use: 24 1 - Amount (size/oz): Varies 1 - Frequency: Varies 1 - Duration: Ongoing 1 - Last Use / Amount: 03/17/19 1 gram  CIWA: CIWA-Ar BP: (!) 100/59 Pulse Rate: 84 COWS:    Allergies: No Known Allergies  Home Medications: (Not in a hospital admission)   OB/GYN Status:  No LMP recorded. Patient has had a hysterectomy.  General Assessment Data Location of Assessment: [facility]  ED TTS Assessment: In system Is this a Tele or Face-to-Face Assessment?: Face-to-Face Is this an Initial Assessment or a Re-assessment for this encounter?: Initial Assessment Patient Accompanied by:: N/A Language Other than English: No Living Arrangements: Other (Comment) What gender do you identify as?: Female Marital status: Married [place] L[place] Pregnancy Status:  No Living Arrangements: Spouse/significant other Can pt return to current living arrangement?: Yes Admission Status: Involuntary Petitioner: Family member Is patient capable of signing voluntary admission?: Yes Referral Source: Self/Family/Friend Insurance type: self pay  Medical Screening Exam [facility] Walk-in ONLY) Medical Exam completed: Yes  Crisis Care Plan Living Arrangements: Spouse/significant other Legal Guardian: (NA) Name of Psychiatrist: None Name of Therapist: None  Education Status Is patient currently in school?: No Is the patient employed, unemployed or receiving disability?: Unemployed  Risk to self with the past 6 months Suicidal Ideation: No Has patient been a risk to self within the past 6 months prior to admission? : No Suicidal Intent: No Has patient had any suicidal intent within the past 6 months prior to admission? : No Is patient at risk for suicide?: No, but patient needs Medical Clearance Suicidal Plan?: No Has patient had any suicidal plan within the past 6 months prior to admission? : No Access to Means: No What has been your use of drugs/alcohol within the last 12 months?: Current use Previous Attempts/Gestures: No How many times?: 0 Other Self Harm Risks: (NA) Triggers for Past Attempts: (NA) Intentional Self Injurious Behavior: None Family Suicide History: No Recent stressful life event(s): Conflict (Comment)(Conflict with spouse) Persecutory voices/beliefs?: No Depression: Yes Depression Symptoms: Feeling worthless/self pity Substance abuse history and/or treatment for substance abuse?: No Suicide prevention information given to non-admitted patients: Not applicable  Risk to Others within the past 6 months Homicidal Ideation: No Does patient have any lifetime risk of violence toward others beyond the six months prior to admission? : No Thoughts of Harm to Others: No Current Homicidal Intent: No Current Homicidal Plan: No Access to  Homicidal Means: No Identified Victim: NA History of harm to others?: No Assessment of Violence: None Noted Violent Behavior Description: NA Does patient have access to weapons?: No Criminal Charges Pending?: No Does patient have a court date: No Is patient on probation?: No  Psychosis Hallucinations: None noted Delusions: None noted  Mental Status Report Appearance/Hygiene: In scrubs Eye Contact: Fair Motor Activity: Freedom of movement Speech: Logical/coherent Level of Consciousness: Quiet/awake Mood: Depressed Affect: Appropriate to circumstance Anxiety Level: Minimal Thought Processes: Coherent, Relevant Judgement: Partial Orientation: Person, Place, Time Obsessive Compulsive Thoughts/Behaviors: None  Cognitive Functioning Concentration: Normal Memory: Recent Intact, Remote Intact Is patient IDD: No Insight: Fair Impulse Control: Poor Appetite: Fair Have you had any weight changes? : No Change Sleep: No Change Total Hours of Sleep: 7 Vegetative Symptoms: None  ADLScreening [facility] Assessment Services) Patient's cognitive ability adequate to safely complete daily activities?: Yes Patient able to express need for assistance with ADLs?: Yes Independently performs ADLs?: Yes (appropriate for developmental age)  Prior Inpatient Therapy Prior Inpatient Therapy: No  Prior Outpatient Therapy Prior Outpatient Therapy: No Does patient have an ACCT team?: No Does patient have Intensive In-House Services?  : No Does patient have [facility] services? : No Does patient have P4CC services?: No  ADL Screening (condition at time of admission) Patient's cognitive ability adequate to safely complete daily activities?: Yes Is the patient deaf or have difficulty hearing?: No Does the patient have difficulty seeing, even when wearing glasses/contacts?: No Does the patient  have difficulty concentrating, remembering, or making decisions?: No Patient able to express need for  assistance with ADLs?: Yes Does the patient have difficulty dressing or bathing?: No Independently performs ADLs?: Yes (appropriate for developmental age) Does the patient have difficulty walking or climbing stairs?: No Weakness of Legs: None Weakness of Arms/Hands: None  Home Assistive Devices/Equipment Home Assistive Devices/Equipment: None  Therapy Consults (therapy consults require a physician order) PT Evaluation Needed: No OT Evalulation Needed: No SLP Evaluation Needed: No Abuse/Neglect Assessment (Assessment to be complete while patient is alone) Physical Abuse: Denies Verbal Abuse: Denies Sexual Abuse: Denies Exploitation of patient/patient's resources: Denies Self-Neglect: Denies Values / Beliefs Cultural Requests During Hospitalization: None Spiritual Requests During Hospitalization: None Consults Spiritual Care Consult Needed: No Social Work Consult Needed: No Merchant navy officerAdvance Directives (For Healthcare) Does Patient Have a Medical Advance Directive?: No Would patient like information on creating a medical advance directive?: No - Patient declined          Disposition: Patient is psychiatrically cleared for discharge.     Disposition Initial Assessment Completed for this Encounter: Yes Disposition of Patient: Discharge Patient refused recommended treatment: No Mode of transportation if patient is discharged/movement?: Loreli Slot([employer]) Patient referred to: Outpatient clinic referral  On Site Evaluation by:   Reviewed with Physician:    Alfredia Fergusonavid L Ugo Thoma 03/18/2019 4:04 PM

## 2019-03-18 NOTE — ED Triage Notes (Signed)
Patient arrived by EMS home. Pt was found by EMS outside her home running around naked. Pt become combative. Pt had to be restrained by EMS.   Pt stated she wanted to kill herself per EMS. Pt has attempted to kill herself per EMS.   Pt mentioned she had some marijuana today per EMS. Pt mentioned she hadn't slept in four days per EMS.

## 2019-03-18 NOTE — ED Notes (Signed)
Patient given discharge teaching and verbalized understanding. Patient ambulated out of ED with a steady gait. 

## 2019-03-18 NOTE — ED Provider Notes (Addendum)
[facility]-EMERGENCY DEPT Provider Note   CSN: [id number] Arrival date & time: 03/18/19  0803     History   Chief Complaint Chief Complaint  Patient presents with   Suicidal   Detox    HPI Tamara Robinson is a 48 y.o. female.     The history is provided by the patient. No language interpreter was used.     48 year old female with history of anxiety, asthma, frequent headache, hypertension brought here via EMS from home for altered mental status.  Per EMS note, patient was found outside her home running around naked and she became very combative, she has to be restrained by EMS.  EMS also states that patient wants to kill herself and attempted to kill herself.  She also admits to using marijuana.  When I talked to the patient, she reports she wants to divorce her husband.  Patient states she has been using a significant amount of marijuana for the past few days.  Furthermore, she has been arguing with her husband, trying to "pissed him off".  She also states she has not eat or sleep for the past 4 days to "make him mad".  She however denies SI or HI.  She did report having hallucinations while using marijuana.  No report of command hallucination.  She denies any recent alcohol use but states that she would like to drink alcohol.  She mentioned she has been clean of alcohol for 5 years.  She denies any other drug use.  She denies any physical pain.  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  Past Medical History:  Diagnosis Date   Allergy    Asthma    Frequent headaches    GAD (generalized anxiety disorder)    GERD (gastroesophageal reflux disease)    Hypertension     Patient Active Problem List   Diagnosis Date Noted   Tobacco abuse 02/04/2017   Acute left-sided low back pain with left-sided sciatica 02/04/2017   Preventative health care 08/19/2016   Prediabetes 08/19/2016   Essential hypertension 05/20/2016   Moderate  persistent asthma without complication 05/20/2016   GAD (generalized anxiety disorder) 05/20/2016   Vitamin B 12 deficiency 05/20/2016   GERD (gastroesophageal reflux disease) 05/20/2016    Past Surgical History:  Procedure Laterality Date   ABDOMINAL HYSTERECTOMY  2006     OB History   No obstetric history on file.      Home Medications    Prior to Admission medications   Medication Sig Start Date End Date Taking? Authorizing Provider  albuterol (VENTOLIN HFA) 108 (90 Base) MCG/ACT inhaler Inhale 2 puffs into the lungs every 6 (six) hours as needed for wheezing or shortness of breath. 07/29/17   Doreene Nestlark, Katherine K, NP  atenolol (TENORMIN) 25 MG tablet TAKE 1 TABLET BY MOUTH EVERY DAY 07/24/17   Doreene Nestlark, Katherine K, NP  clonazePAM (KLONOPIN) 1 MG tablet Take 1/2 tablet by mouth every morning and 1 tablet by mouth every evening as needed for anxiety. 02/10/18   Lorre MunroeBaity, Regina W, NP  HYDROcodone-acetaminophen (NORCO/VICODIN) 5-325 MG tablet Take 2 tablets by mouth every 4 (four) hours as needed. 02/06/19   Henderly, Britni A, PA-C  omeprazole (PRILOSEC) 40 MG capsule TAKE ONE CAPSULE BY MOUTH EVERY DAY 11/11/17   Doreene Nestlark, Katherine K, NP  ondansetron (ZOFRAN ODT) 4 MG disintegrating tablet Take 1 tablet (4 mg total) by mouth every 8 (eight) hours as needed for nausea or vomiting. 02/06/19   Henderly,  Britni A, PA-C  PULMICORT FLEXHALER 180 MCG/ACT inhaler INHALE 1 PUFF INTO THE LUNGS 2 (TWO) TIMES DAILY. 07/13/17   Doreene Nestlark, Katherine K, NP  triamcinolone (KENALOG) 0.1 % paste Use as directed 1 application in the mouth or throat 2 (two) times daily. 01/05/18   Doreene Nestlark, Katherine K, NP  triamcinolone ointment (KENALOG) 0.5 % Apply 1 application topically 2 (two) times daily. 07/29/17   Doreene Nestlark, Katherine K, NP    Family History History reviewed. No pertinent family history.  Social History Social History   Tobacco Use   Smoking status: Current Every Day Smoker    Packs/day: 1.00   Smokeless  tobacco: Never Used  Substance Use Topics   Alcohol use: Yes    Comment: weeky   Drug use: Not on file     Allergies   Patient has no known allergies.   Review of Systems Review of Systems  All other systems reviewed and are negative.    Physical Exam Updated Vital Signs BP (!) 160/89    Pulse (!) 114    Temp 99.6 F (37.6 C) (Oral)    Resp 14    Wt 94.7 kg    SpO2 97%    BMI 36.12 kg/m   Physical Exam Vitals signs and nursing note reviewed.  Constitutional:      General: She is not in acute distress.    Appearance: She is well-developed. She is obese.  HENT:     Head: Atraumatic.  Eyes:     Conjunctiva/sclera: Conjunctivae normal.  Neck:     Musculoskeletal: Neck supple.  Cardiovascular:     Rate and Rhythm: Normal rate and regular rhythm.     Pulses: Normal pulses.     Heart sounds: Normal heart sounds.  Pulmonary:     Effort: Pulmonary effort is normal.     Breath sounds: Normal breath sounds.  Abdominal:     Palpations: Abdomen is soft.     Tenderness: There is no abdominal tenderness.  Skin:    Findings: No rash.  Neurological:     Mental Status: She is alert.     GCS: GCS eye subscore is 4. GCS verbal subscore is 5. GCS motor subscore is 6.     Cranial Nerves: Cranial nerves are intact.     Comments: Patient is alert and oriented to self and situation, unable to tell place, time, or current event.  Psychiatric:        Attention and Perception: She is inattentive.        Mood and Affect: Mood is anxious. Affect is labile.        Speech: Speech is rapid and pressured.        Behavior: Behavior is hyperactive.        Thought Content: Thought content does not include homicidal or suicidal ideation.        Judgment: Judgment is impulsive.      ED Treatments / Results  Labs (all labs ordered are listed, but only abnormal results are displayed) Labs Reviewed  ACETAMINOPHEN LEVEL - Abnormal; Notable for the following components:      Result Value    Acetaminophen (Tylenol), Serum <10 (*)    All other components within normal limits  COMPREHENSIVE METABOLIC PANEL - Abnormal; Notable for the following components:   Glucose, Bld 135 (*)    Calcium 10.4 (*)    Total Protein 8.4 (*)    Albumin 5.2 (*)    All other components within normal limits  RAPID URINE DRUG SCREEN, HOSP PERFORMED - Abnormal; Notable for the following components:   Tetrahydrocannabinol POSITIVE (*)    All other components within normal limits  CBC WITH DIFFERENTIAL/PLATELET - Abnormal; Notable for the following components:   WBC 20.3 (*)    Hemoglobin 16.0 (*)    HCT 48.2 (*)    Neutro Abs 14.4 (*)    Lymphs Abs 4.2 (*)    Monocytes Absolute 1.4 (*)    Abs Immature Granulocytes 0.09 (*)    All other components within normal limits  SARS CORONAVIRUS 2 (HOSPITAL ORDER, [facility])  ETHANOL  SALICYLATE LEVEL  PREGNANCY, URINE    EKG None  Radiology No results found.  Procedures Procedures (including critical care time)  Medications Ordered in ED Medications  acetaminophen (TYLENOL) tablet 650 mg (has no administration in time range)  zolpidem (AMBIEN) tablet 5 mg (has no administration in time range)  ondansetron (ZOFRAN) tablet 4 mg (has no administration in time range)  alum & mag hydroxide-simeth (MAALOX/MYLANTA) 200-200-20 MG/5ML suspension 30 mL (has no administration in time range)  nicotine (NICODERM CQ - dosed in mg/24 hours) patch 21 mg (21 mg Transdermal Patch Applied 03/18/19 0936)  atenolol (TENORMIN) tablet 25 mg (25 mg Oral Given 03/18/19 0936)  budesonide (PULMICORT) 180 MCG/ACT inhaler 2 puff (has no administration in time range)  ziprasidone (GEODON) injection 20 mg (20 mg Intramuscular Given 03/18/19 1108)  sterile water (preservative free) injection (1.3 mLs  Given 03/18/19 1131)     Initial Impression / Assessment and Plan / ED Course  I have reviewed the triage vital signs and the nursing notes.  Pertinent  labs & imaging results that were available during my care of the patient were reviewed by me and considered in my medical decision making (see chart for details).        BP (!) 160/89    Pulse (!) 114    Temp 99.6 F (37.6 C) (Oral)    Resp 14    Wt 94.7 kg    SpO2 97%    BMI 36.12 kg/m    Final Clinical Impressions(s) / ED Diagnoses   Final diagnoses:  Psychosis, unspecified psychosis type ([place])  Suicidal ideation    ED Discharge Orders    None     8:42 AM Patient brought to the ED due to erratic behaviors including running around the neighborhood naked, having argument with her husband, and threatened to kill self.  Patient denies any suicidal ideation however she has pressured speech, and appears to be exhibiting psychosis.  Will perform medical screening exam and will consult psychiatry for evaluation.  10:02 AM Pt is threatened to leave, IVC paper filed.  Pt is exhibiting manic behavior with psychosis and unsafe for herself and others.  She is currently aggressive to staff.   10:59 AM Pt became increasing aggressive towrads staff and will need chemical restraint for her safety and staff safety.  Will order Geodon and will monitor closely.   Nurse also request soft wrist restraint.  2:24 PM  WBC 20, likely 2/2 stress demargination.  Doubt infectious condition. Pt now much more comfortable and rested.  Restraints have been removed.  Pt is medically clear awaits recommendation from TTS.  3:04 PM Psychiatry have evaluated pt and felt she is not truly suicidal and that she is confabulating about her symptoms to spite her husband.  However, I do not think she is back to her baseline and felt she is not  stable to be discharge at this time.  TTS will communicate with psychiatry for further recommendation.  I discussed care with Dr. Juleen ChinaKohut.    Fayrene Helperran, Logen Fowle, PA-C 03/18/19 1453    Fayrene Helperran, Domenique Southers, PA-C 03/18/19 1506    Raeford RazorKohut, Stephen, MD 03/19/19 [phone]

## 2019-03-18 NOTE — ED Notes (Signed)
Pt charged at NT multiple times. Threatened to punch NT. Pt also placed her hands on NT. Pt will not stay in bed. RN, MD, PA, & charge nurse have been informed of Pt's behavior

## 2019-03-18 NOTE — BH Assessment (Signed)
[place] Assessment Progress Note  Per Corena Pilgrim, MD, this pt does not require psychiatric hospitalization at this time.  Pt presents under IVC initiated by pt's spouse, and upheld by EDP Virgel Manifold, MD, which Dr Darleene Cleaver has rescinded. Pt is to be discharged from [facility].  Jinny Blossom, PMHNP agrees to enter any necessary discharge instructions.  Pt's nurse has been notified.  Jalene Mullet, [place] Triage Specialist [phone]

## 2019-03-18 NOTE — ED Notes (Signed)
Restraints have been released. Patient is no longer restrained. Patient is calm and cooperative at this time.   Patient ambulated to bathroom w/ tech assistance.

## 2019-03-18 NOTE — ED Notes (Addendum)
Patient was cooperative upon arrival to ED. Patient was able to answer questions and make ED staff aware of chief complaint.  Patient stated she wants a divorce. Patient stated she has problems within her marriage. Patient stated she hurt her daughter. Patient stated she had issues with alcohol in the past. Patient stated she smoked a lot of marijuana that made her hallucinate. Patient stated she's tried to harm herself in the past.   Patient's behavior has become erratic. Patient constantly tries to get out of bed. Patient is becoming more agitated. Patient makes statements towards her husband as if husband is in the room. Patient stated "she wants divorce". Patient stated that she wanted to hit a staff member. ED staff made patient aware that this behavior is not appropriate.   Patient stated "I'm schizophrenic."   Patient constantly states she needs to go to the bathroom and then states that she doesn't need to go.   This Probation officer will continue to monitor.

## 2019-03-18 NOTE — Discharge Instructions (Signed)
For your mental health needs, please follow up at:   [facility] [address] [place], [place] [address] [phone] Crisis line  [phone]  For your substance abuse needs, please follow up at: [facility] [address]  [place], [place] [address] [phone]

## 2019-03-18 NOTE — ED Notes (Signed)
Patient became extremely violent w/ staff. Patient began Web designer. Staff was unable to verbally redirect patient's behavior.   Provider was made aware of need for further advice for patient plan of care.

## 2019-03-18 NOTE — Progress Notes (Addendum)
Patient ID: Tamara Robinson, female   DOB: 1971-04-07, 48 y.o.   MRN: [id number]    Pt was seen and chart reviewed with treatment team and Dr Darleene Cleaver. Pt denies suicidal/homicidal ideation, denies auditory/visual hallucinations. Pt stated she and her husband have been arguing and she is purposely doing things to make him mad. She stated she wants to divorce him. She does admit to smoking a lot of weed and has been hallucinating because of this. She stated she may have gotten some "bad weed." Pt does have a history in 2019 of weaning herself off of clonazepam, under supervision form her PCP. She could be utilizing marijuana to help with her anxiety. Her erratic behavior could be contributed to excessive marijuana use, especially if it has been laced with some other substance. Pt's husband took out an IVC, which appears to be based on a "history of past behaviors." Pt will be provided outpatient resources for medication management and therapy. As above, Pt is denying suicidal and homicidal ideations or a plan.  Pt should return to the emergency room or call 911 if her symptoms do not improve or worsen. Pt is psychiatrically clear.   Ethelene Hal, PMHNP-BC 03/18/2019      1351 Patient seen face-to-face for psychiatric evaluation, chart reviewed and case discussed with the physician extender and developed treatment plan. Reviewed the information documented and agree with the treatment plan. Corena Pilgrim, MD

## 2019-03-19 ENCOUNTER — Encounter (HOSPITAL_COMMUNITY): Payer: Self-pay | Admitting: Registered Nurse

## 2019-03-19 ENCOUNTER — Emergency Department (HOSPITAL_COMMUNITY)
Admission: EM | Admit: 2019-03-19 | Discharge: 2019-03-21 | Disposition: A | Discharge status: Other Acute Inpatient Hospital | Payer: Self-pay | Attending: Emergency Medicine | Admitting: Emergency Medicine

## 2019-03-19 ENCOUNTER — Other Ambulatory Visit: Payer: Self-pay

## 2019-03-19 DIAGNOSIS — Z20828 Contact with and (suspected) exposure to other viral communicable diseases: Secondary | ICD-10-CM | POA: Insufficient documentation

## 2019-03-19 DIAGNOSIS — Z8709 Personal history of other diseases of the respiratory system: Secondary | ICD-10-CM | POA: Insufficient documentation

## 2019-03-19 DIAGNOSIS — F29 Unspecified psychosis not due to a substance or known physiological condition: Secondary | ICD-10-CM | POA: Insufficient documentation

## 2019-03-19 DIAGNOSIS — F32A Depression, unspecified: Secondary | ICD-10-CM

## 2019-03-19 DIAGNOSIS — I10 Essential (primary) hypertension: Secondary | ICD-10-CM | POA: Insufficient documentation

## 2019-03-19 DIAGNOSIS — F17210 Nicotine dependence, cigarettes, uncomplicated: Secondary | ICD-10-CM | POA: Insufficient documentation

## 2019-03-19 DIAGNOSIS — F12151 Cannabis abuse with psychotic disorder with hallucinations: Secondary | ICD-10-CM

## 2019-03-19 DIAGNOSIS — F329 Major depressive disorder, single episode, unspecified: Secondary | ICD-10-CM | POA: Insufficient documentation

## 2019-03-19 DIAGNOSIS — F1290 Cannabis use, unspecified, uncomplicated: Secondary | ICD-10-CM | POA: Insufficient documentation

## 2019-03-19 DIAGNOSIS — Z79899 Other long term (current) drug therapy: Secondary | ICD-10-CM | POA: Insufficient documentation

## 2019-03-19 LAB — RAPID URINE DRUG SCREEN, HOSP PERFORMED
Amphetamines: NOT DETECTED
Barbiturates: NOT DETECTED
Benzodiazepines: NOT DETECTED
Cocaine: NOT DETECTED
Opiates: NOT DETECTED
Tetrahydrocannabinol: POSITIVE — AB

## 2019-03-19 LAB — COMPREHENSIVE METABOLIC PANEL
ALT: 32 U/L (ref 0–44)
AST: 53 U/L — ABNORMAL HIGH (ref 15–41)
Albumin: 5 g/dL (ref 3.5–5.0)
Alkaline Phosphatase: 84 U/L (ref 38–126)
Anion gap: 18 — ABNORMAL HIGH (ref 5–15)
BUN: 21 mg/dL — ABNORMAL HIGH (ref 6–20)
CO2: 18 mmol/L — ABNORMAL LOW (ref 22–32)
Calcium: 10 mg/dL (ref 8.9–10.3)
Chloride: 103 mmol/L (ref 98–111)
Creatinine, Ser: 1.24 mg/dL — ABNORMAL HIGH (ref 0.44–1.00)
GFR calc Af Amer: 59 mL/min — ABNORMAL LOW (ref >60)
GFR calc non Af Amer: 51 mL/min — ABNORMAL LOW (ref >60)
Glucose, Bld: 162 mg/dL — ABNORMAL HIGH (ref 70–99)
Potassium: 4.4 mmol/L (ref 3.5–5.1)
Sodium: 139 mmol/L (ref 135–145)
Total Bilirubin: 0.5 mg/dL (ref 0.3–1.2)
Total Protein: 7.8 g/dL (ref 6.5–8.1)

## 2019-03-19 LAB — CBC WITH DIFFERENTIAL/PLATELET
Abs Immature Granulocytes: 0.14 10*3/uL — ABNORMAL HIGH (ref 0.00–0.07)
Basophils Absolute: 0.1 10*3/uL (ref 0.0–0.1)
Basophils Relative: 1 %
Eosinophils Absolute: 0 10*3/uL (ref 0.0–0.5)
Eosinophils Relative: 0 %
HCT: 47.2 % — ABNORMAL HIGH (ref 36.0–46.0)
Hemoglobin: 15.6 g/dL — ABNORMAL HIGH (ref 12.0–15.0)
Immature Granulocytes: 1 %
Lymphocytes Relative: 18 %
Lymphs Abs: 3.6 10*3/uL (ref 0.7–4.0)
MCH: 32.2 pg (ref 26.0–34.0)
MCHC: 33.1 g/dL (ref 30.0–36.0)
MCV: 97.3 fL (ref 80.0–100.0)
Monocytes Absolute: 1.5 10*3/uL — ABNORMAL HIGH (ref 0.1–1.0)
Monocytes Relative: 8 %
Neutro Abs: 14.8 10*3/uL — ABNORMAL HIGH (ref 1.7–7.7)
Neutrophils Relative %: 72 %
Platelets: 399 10*3/uL (ref 150–400)
RBC: 4.85 MIL/uL (ref 3.87–5.11)
RDW: 12.9 % (ref 11.5–15.5)
WBC: 20.2 10*3/uL — ABNORMAL HIGH (ref 4.0–10.5)
nRBC: 0 % (ref 0.0–0.2)

## 2019-03-19 LAB — URINALYSIS, ROUTINE W REFLEX MICROSCOPIC
Bilirubin Urine: NEGATIVE
Glucose, UA: NEGATIVE mg/dL
Hgb urine dipstick: NEGATIVE
Ketones, ur: 5 mg/dL — AB
Nitrite: NEGATIVE
Protein, ur: NEGATIVE mg/dL
Specific Gravity, Urine: 1.011 (ref 1.005–1.030)
pH: 5 (ref 5.0–8.0)

## 2019-03-19 LAB — I-STAT BETA HCG BLOOD, ED (MC, WL, AP ONLY): I-stat hCG, quantitative: <5 m[IU]/mL (ref <5)

## 2019-03-19 LAB — SALICYLATE LEVEL: Salicylate Lvl: <7 mg/dL (ref 2.8–30.0)

## 2019-03-19 LAB — ETHANOL: Alcohol, Ethyl (B): <10 mg/dL (ref <10)

## 2019-03-19 LAB — SARS CORONAVIRUS 2 BY RT PCR (HOSPITAL ORDER, PERFORMED IN ~~LOC~~ HOSPITAL LAB): SARS Coronavirus 2: NEGATIVE

## 2019-03-19 LAB — ACETAMINOPHEN LEVEL: Acetaminophen (Tylenol), Serum: <10 ug/mL — ABNORMAL LOW (ref 10–30)

## 2019-03-19 MED ORDER — NICOTINE 7 MG/24HR TD PT24
7.0000 mg | MEDICATED_PATCH | Freq: Once | TRANSDERMAL | Status: AC
  Administered 2019-03-19: 7 mg via TRANSDERMAL
  Filled 2019-03-19: qty 1

## 2019-03-19 MED ORDER — ALUM & MAG HYDROXIDE-SIMETH 200-200-20 MG/5ML PO SUSP
15.0000 mL | Freq: Four times a day (QID) | ORAL | Status: DC | PRN
  Administered 2019-03-19: 15 mL via ORAL
  Filled 2019-03-19: qty 30

## 2019-03-19 MED ORDER — CLONAZEPAM 0.5 MG PO TABS: 0.5000 mg | ORAL_TABLET | Freq: Once | ORAL | Status: DC

## 2019-03-19 MED ORDER — ZIPRASIDONE MESYLATE 20 MG IM SOLR
20.0000 mg | INTRAMUSCULAR | Status: DC | PRN
  Administered 2019-03-19 – 2019-03-21 (×3): 20 mg via INTRAMUSCULAR
  Filled 2019-03-19 (×3): qty 20

## 2019-03-19 MED ORDER — STERILE WATER FOR INJECTION IJ SOLN
INTRAMUSCULAR | Status: AC
  Administered 2019-03-19: 10:00:00
  Filled 2019-03-19: qty 10

## 2019-03-19 MED ORDER — TRAZODONE HCL 50 MG PO TABS
50.0000 mg | ORAL_TABLET | Freq: Every evening | ORAL | Status: DC | PRN
  Administered 2019-03-19 – 2019-03-20 (×2): 50 mg via ORAL
  Filled 2019-03-19 (×2): qty 1

## 2019-03-19 MED ORDER — HYDROXYZINE HCL 25 MG PO TABS
25.0000 mg | ORAL_TABLET | Freq: Three times a day (TID) | ORAL | Status: DC | PRN
  Administered 2019-03-19 – 2019-03-20 (×3): 25 mg via ORAL
  Filled 2019-03-19 (×3): qty 1

## 2019-03-19 MED ORDER — GABAPENTIN 300 MG PO CAPS
300.0000 mg | ORAL_CAPSULE | Freq: Two times a day (BID) | ORAL | Status: DC
  Administered 2019-03-19 – 2019-03-21 (×5): 300 mg via ORAL
  Filled 2019-03-19 (×6): qty 1

## 2019-03-19 MED ORDER — LORAZEPAM 1 MG PO TABS
1.0000 mg | ORAL_TABLET | Freq: Once | ORAL | Status: AC
  Administered 2019-03-19: 1 mg via ORAL
  Filled 2019-03-19: qty 1

## 2019-03-19 MED ORDER — STERILE WATER FOR INJECTION IJ SOLN
INTRAMUSCULAR | Status: AC
  Administered 2019-03-19: 19:00:00
  Filled 2019-03-19: qty 10

## 2019-03-19 MED ORDER — SODIUM CHLORIDE 0.9 % IV BOLUS: 1000.0000 mL | Freq: Once | INTRAVENOUS | Status: DC

## 2019-03-19 MED ORDER — LORAZEPAM 1 MG PO TABS
1.0000 mg | ORAL_TABLET | ORAL | Status: DC | PRN
  Administered 2019-03-19 – 2019-03-20 (×2): 1 mg via ORAL
  Filled 2019-03-19 (×4): qty 1

## 2019-03-19 NOTE — ED Notes (Signed)
Pt c/o indigestion and heartburn, this nurse notified EDP, awaiting orders.

## 2019-03-19 NOTE — Progress Notes (Signed)
Patient continues to present disorganized in her thought process, bizarre, and visibly anxious. Orders received by provider for medications to reduce anxieties and for sleep-aid, per patient request for sleep-aid. Pt. Complaint with taking medications. Pt. Presentation reviewed with ER physician.

## 2019-03-19 NOTE — ED Notes (Signed)
Patient observed going into patient bathroom and inducing vomitus of her medications that she just took compliantly as she requested for sleep and offered and accepted for anxiety. Will notify provider.

## 2019-03-19 NOTE — ED Notes (Addendum)
Report on Situation, Background, Assessment, and Recommendations received from [place], [place]. Patient alert and visibly agitated, but in no visible distress of pain. Patient denies SI, HI, AVH and pain when asked. Patient made aware of 1:1 and security cameras for their safety. Patient instructed to come to me with needs or concerns. Pt. Visibly disorganized and difficult to redirect from inappropriate behavior in her room. Pt. Refuses PO PRN medication from day shift nurse, but accepts IM medications form this RN for comfort and safety, to reduce agitations.

## 2019-03-19 NOTE — ED Notes (Signed)
Spoke with lab, able to add UA to previously collected specimen.

## 2019-03-19 NOTE — Progress Notes (Signed)
Pt. Continues to refuse vital signs and be disorganized in her thought process.

## 2019-03-19 NOTE — BH Assessment (Signed)
[place] Assessment Progress Note  Case was staffed with Akintayo MD who recommended a inpatient admission to assist with stabilization.

## 2019-03-19 NOTE — ED Notes (Signed)
Pt sitting up and eating lunch, sitter present. Will continue to monitor.
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
## 2019-03-19 NOTE — Progress Notes (Signed)
Patient fell asleep at this time, refusing to take scheduled/PRN medications and be complaint with vital signs. Will continue to monitor for safety.

## 2019-03-19 NOTE — ED Notes (Signed)
Pt behavior escalating, becoming increasingly agitated, behavior not redirectable, this nurse notified EDP, attempted to give pt PRN Ativan as ordered, pt refusing this medication. Referring to this nurse as "Bitch" telling this nurse "I wish you would die." oncoming nurse notified of pt behaviors.

## 2019-03-19 NOTE — ED Triage Notes (Addendum)
Pt to ED via sheriff, IVC per sheriff pt endorsing SI. Sheriff reports pt combative at times during transport. With this nurse pt behavior labile, requiring frequent redirection, irritable, paranoid. Pt behavior unpredictable, Sheriff at bedside. Pt reports "I'm not suicidal, they will keep me, I just dont want a divorce, 25 years is a long time." Pt dressed out into wine color scrubs, wanded by security. Personal property locked in locker.

## 2019-03-19 NOTE — ED Notes (Signed)
Pt c/o increase anxiety, pt becoming increasingly agitated. This nurse notified EDP and psych team

## 2019-03-19 NOTE — Progress Notes (Signed)
Patient now awake, visibly anxious, and disorganized in her thought process. Pt. Encouraged to take PRN/scheduled medications for comfort and safety. Pt. Accepts medications after lots of patient education. Will continue to monitor closely, due to labile behavior/mood.

## 2019-03-19 NOTE — Progress Notes (Signed)
Patient meets criteria for inpatient treatment. No appropriate or available beds at [facility]. CSW faxed referrals to the following facilities for review:  [facility]  [facility]  [facility]  [facility]   TTS will continue to seek bed placement.  Chalmers Guest. Guerry Bruin, MSW, [place] Work/Disposition Phone: [phone] Fax: [phone]

## 2019-03-19 NOTE — Progress Notes (Signed)
IM PRN medications able to help reduce agitations and discomfort for the patient. Will continue to monitor for safety.

## 2019-03-20 ENCOUNTER — Encounter (HOSPITAL_COMMUNITY): Payer: Self-pay | Admitting: Registered Nurse

## 2019-03-20 DIAGNOSIS — F12151 Cannabis abuse with psychotic disorder with hallucinations: Secondary | ICD-10-CM

## 2019-03-20 MED ORDER — RISPERIDONE 0.5 MG PO TABS
0.5000 mg | ORAL_TABLET | Freq: Two times a day (BID) | ORAL | Status: DC
  Administered 2019-03-20 – 2019-03-21 (×3): 0.5 mg via ORAL
  Filled 2019-03-20 (×3): qty 1

## 2019-03-20 MED ORDER — NICOTINE 7 MG/24HR TD PT24
7.0000 mg | MEDICATED_PATCH | Freq: Every day | TRANSDERMAL | Status: DC
  Administered 2019-03-20: 7 mg via TRANSDERMAL
  Filled 2019-03-20 (×2): qty 1

## 2019-03-20 MED ORDER — ALBUTEROL SULFATE HFA 108 (90 BASE) MCG/ACT IN AERS: 2.0000 | INHALATION_SPRAY | Freq: Four times a day (QID) | RESPIRATORY_TRACT | Status: DC | PRN

## 2019-03-20 MED ORDER — ATENOLOL 25 MG PO TABS
25.0000 mg | ORAL_TABLET | Freq: Every day | ORAL | Status: DC
  Administered 2019-03-20 – 2019-03-21 (×2): 25 mg via ORAL
  Filled 2019-03-20 (×2): qty 1

## 2019-03-20 MED ORDER — CLONAZEPAM 1 MG PO TABS: 1.0000 mg | ORAL_TABLET | Freq: Two times a day (BID) | ORAL | Status: DC | PRN

## 2019-03-20 MED ORDER — PANTOPRAZOLE SODIUM 40 MG PO TBEC
40.0000 mg | DELAYED_RELEASE_TABLET | Freq: Every day | ORAL | Status: DC
  Administered 2019-03-20 – 2019-03-21 (×2): 40 mg via ORAL
  Filled 2019-03-20 (×2): qty 1
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
## 2019-03-20 NOTE — ED Notes (Signed)
Nad, eating crackers

## 2019-03-20 NOTE — Progress Notes (Signed)
CSW received updates from the following referral facilities:  Pt on waitlist at [facility]. Declined due to acuity at [place]. [facility] and [facility] are at capacity currently.   TTS will continue to seek placement.   Chalmers Guest. Guerry Bruin, MSW, [place] Work/Disposition Phone: [phone] Fax: [phone]

## 2019-03-20 NOTE — ED Notes (Signed)
Smiling, reports she feels better
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
## 2019-03-20 NOTE — ED Notes (Signed)
Anxious, tearful at times, support given.

## 2019-03-20 NOTE — Progress Notes (Signed)
Pt. Up reporting she is feeling anxious and wants medications. Will give PRN medication for anxiety.

## 2019-03-20 NOTE — ED Notes (Signed)
Pt sitting quielty on the bed asked if she is "here for ever..If I am it OK.Marland KitchenMarland KitchenMarland KitchenI'm confused..." Support given.
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
## 2019-03-20 NOTE — ED Notes (Signed)
Up tot he bathroom to shower and change scrubs 

## 2019-03-20 NOTE — ED Notes (Signed)
Tele psych eval in progress w/ dr Tiburcio Pea and Nilda Simmer NP

## 2019-03-20 NOTE — ED Notes (Signed)
Calmer, but still restless

## 2019-03-21 ENCOUNTER — Encounter (HOSPITAL_COMMUNITY): Payer: Self-pay

## 2019-03-21 ENCOUNTER — Other Ambulatory Visit: Payer: Self-pay

## 2019-03-21 ENCOUNTER — Inpatient Hospital Stay (HOSPITAL_COMMUNITY)
Admission: AD | Admit: 2019-03-21 | Discharge: 2019-03-24 | DRG: 897 | Disposition: A | Discharge status: Home / Self Care | Payer: Federal, State, Local not specified - Other | Attending: Psychiatry | Admitting: Psychiatry

## 2019-03-21 DIAGNOSIS — F12151 Cannabis abuse with psychotic disorder with hallucinations: Secondary | ICD-10-CM | POA: Diagnosis present

## 2019-03-21 DIAGNOSIS — F17210 Nicotine dependence, cigarettes, uncomplicated: Secondary | ICD-10-CM | POA: Diagnosis present

## 2019-03-21 DIAGNOSIS — F302 Manic episode, severe with psychotic symptoms: Secondary | ICD-10-CM | POA: Diagnosis present

## 2019-03-21 DIAGNOSIS — K219 Gastro-esophageal reflux disease without esophagitis: Secondary | ICD-10-CM | POA: Diagnosis present

## 2019-03-21 DIAGNOSIS — G4700 Insomnia, unspecified: Secondary | ICD-10-CM | POA: Diagnosis present

## 2019-03-21 DIAGNOSIS — J449 Chronic obstructive pulmonary disease, unspecified: Secondary | ICD-10-CM | POA: Diagnosis present

## 2019-03-21 MED ORDER — HALOPERIDOL LACTATE 5 MG/ML IJ SOLN: 10.0000 mg | Freq: Four times a day (QID) | INTRAMUSCULAR | Status: DC | PRN

## 2019-03-21 MED ORDER — NICOTINE 21 MG/24HR TD PT24
21.0000 mg | MEDICATED_PATCH | Freq: Every day | TRANSDERMAL | Status: DC
  Administered 2019-03-23 – 2019-03-24 (×2): 21 mg via TRANSDERMAL
  Filled 2019-03-21 (×6): qty 1

## 2019-03-21 MED ORDER — ACETAMINOPHEN 325 MG PO TABS
650.0000 mg | ORAL_TABLET | Freq: Four times a day (QID) | ORAL | Status: DC | PRN
  Administered 2019-03-23: 12:00:00 650 mg via ORAL
  Filled 2019-03-21: qty 2

## 2019-03-21 MED ORDER — STERILE WATER FOR INJECTION IJ SOLN
INTRAMUSCULAR | Status: AC
  Administered 2019-03-21: 10:00:00
  Filled 2019-03-21: qty 10

## 2019-03-21 MED ORDER — ZIPRASIDONE MESYLATE 20 MG IM SOLR: 20.0000 mg | Freq: Four times a day (QID) | INTRAMUSCULAR | Status: DC | PRN

## 2019-03-21 MED ORDER — CARVEDILOL 25 MG PO TABS
25.0000 mg | ORAL_TABLET | Freq: Two times a day (BID) | ORAL | Status: DC
  Administered 2019-03-22 – 2019-03-24 (×5): 25 mg via ORAL
  Filled 2019-03-21 (×10): qty 1

## 2019-03-21 MED ORDER — CLONAZEPAM 1 MG PO TABS
2.0000 mg | ORAL_TABLET | Freq: Every day | ORAL | Status: DC
  Administered 2019-03-21: 21:00:00 2 mg via ORAL
  Filled 2019-03-21: qty 2

## 2019-03-21 MED ORDER — MAGNESIUM HYDROXIDE 400 MG/5ML PO SUSP: 30.0000 mL | Freq: Every day | ORAL | Status: DC | PRN

## 2019-03-21 MED ORDER — LORAZEPAM 2 MG/ML IJ SOLN
4.0000 mg | Freq: Once | INTRAMUSCULAR | Status: AC
  Administered 2019-03-21: 16:00:00 4 mg via INTRAMUSCULAR
  Filled 2019-03-21: qty 2

## 2019-03-21 MED ORDER — ALUM & MAG HYDROXIDE-SIMETH 200-200-20 MG/5ML PO SUSP
30.0000 mL | ORAL | Status: DC | PRN
  Administered 2019-03-22 – 2019-03-23 (×2): 30 mL via ORAL
  Filled 2019-03-21 (×2): qty 30

## 2019-03-21 MED ORDER — CARBAMAZEPINE 100 MG PO CHEW
100.0000 mg | CHEWABLE_TABLET | Freq: Three times a day (TID) | ORAL | Status: DC
  Administered 2019-03-21 – 2019-03-24 (×10): 100 mg via ORAL
  Filled 2019-03-21: qty 21
  Filled 2019-03-21 (×9): qty 1
  Filled 2019-03-21 (×3): qty 21
  Filled 2019-03-21: qty 1
  Filled 2019-03-21: qty 21
  Filled 2019-03-21: qty 1
  Filled 2019-03-21: qty 21
  Filled 2019-03-21 (×3): qty 1

## 2019-03-21 MED ORDER — HALOPERIDOL 5 MG PO TABS
5.0000 mg | ORAL_TABLET | Freq: Four times a day (QID) | ORAL | Status: DC | PRN
  Administered 2019-03-21 – 2019-03-24 (×7): 5 mg via ORAL
  Filled 2019-03-21 (×3): qty 1

## 2019-03-21 MED ORDER — RISPERIDONE 3 MG PO TABS
3.0000 mg | ORAL_TABLET | Freq: Two times a day (BID) | ORAL | Status: DC
  Administered 2019-03-21 – 2019-03-24 (×6): 3 mg via ORAL
  Filled 2019-03-21 (×11): qty 1

## 2019-03-21 NOTE — ED Notes (Signed)
Report given to [facility] RN at [facility] and [employer] called for transport.
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
## 2019-03-21 NOTE — Progress Notes (Signed)
Adult Psychoeducational Group Note  Date:  03/21/2019 Time:  10:26 PM  Group Topic/Focus:  Wrap-Up Group:   The focus of this group is to help patients review their daily goal of treatment and discuss progress on daily workbooks.  Participation Level:  Minimal  Participation Quality:  Appropriate  Affect:  Appropriate  Cognitive:  Appropriate  Insight: Limited  Engagement in Group:  Limited  Modes of Intervention:  Discussion  Additional Comments: Pt stated her goal for today was to focus on her treatment plan. Pt stated she felt she accomplished her goal today. Pt stated her relationship with her family has remain the same since she was admitted here. Pt stated she felt the same today about herself as yesterday. Pt rated her overall day a 6 out of 10. Pt stated her appetite was poor today. Pt stated her goal for tonight is to get some rest. Pt complain of no pain this evening. Pt stated she would alert staff if anything changes.   Tamara Robinson 03/21/2019, 10:26 PM

## 2019-03-21 NOTE — [facility]HH Group Notes (Signed)
[facility] LCSW Group Therapy Note  Date/Time: 03/21/2019 @ 2:00pm  Type of Therapy and Topic:  Group Therapy:  Overcoming Obstacles  Participation Level:  Active  Description of Group:    In this group patients will be encouraged to explore what they see as obstacles to their own wellness and recovery. They will be guided to discuss their thoughts, feelings, and behaviors related to these obstacles. The group will process together ways to cope with barriers, with attention given to specific choices patients can make. Each patient will be challenged to identify changes they are motivated to make in order to overcome their obstacles. This group will be process-oriented, with patients participating in exploration of their own experiences as well as giving and receiving support and challenge from other group members.  Therapeutic Goals: 1. Patient will identify personal and current obstacles as they relate to admission. 2. Patient will identify barriers that currently interfere with their wellness or overcoming obstacles.  3. Patient will identify feelings, thought process and behaviors related to these barriers. 4. Patient will identify two changes they are willing to make to overcome these obstacles:    Summary of Patient Progress   Patient was active and engaged throughout group. Patient did cry most of the group and had to be redirected due to talking over other group members. Patient kept asking "will I be here forever" and stating that she has no idea why she is here and she does not know where she is. Patient was able to identify her personal and current obstacles which are her anger and possibly losing her daughter. Patient was able to identify barriers that currently interfere with her overcoming her obstacles which are drugs and her hating to admit when she is wrong. Patient was able to identify two changes that she is willing to make to overcome her obstacles which are getting therapy and  taking her medications.    Therapeutic Modalities:   Cognitive Behavioral Therapy Solution Focused Therapy Motivational Interviewing Relapse Prevention Therapy   Ardelle Anton, LCSW

## 2019-03-21 NOTE — Progress Notes (Addendum)
Patient ID: Tamara Robinson, female   DOB: 1970/09/20, 48 y.o.   MRN: [id number]  Admission Note  On approach in the search room, patient refused to comply with writer's requests. Patient was uncooperative, paranoid and confrontational to Probation officer. Patient demanding to see a doctor or a Civil Service fast streamer. AC Tina T. Called into room for assistance. Patient skin assessment was complete and unremarkable. Patient refused to sign consents. No items locked up on admission, patient's only item was a shirt provided to her on the unit. Vitals unable to be obtained due to patient's agitation. Patient escorted onto the 500 hall. Patient denies SI/HI and states she is here because "I did all the drugs, naked". Patient states, "you're going to keep me here forever, aren't you?" Admission completed via chart review as patient is unable to answer questions appropriately. Safety checks initiated. Report given to receiving RN. Patient in no current distress.  Per chart review, patient was found running outside her house naked. Patient IVC'd by husband for bizarre behavior. Patient's UDS revealed THC and BAL was neg.

## 2019-03-21 NOTE — Tx Team (Signed)
Initial Treatment Plan 03/21/2019 1:54 PM Rayne Du KCM:[id number]    PATIENT STRESSORS: Substance abuse   PATIENT STRENGTHS: Average or above average intelligence Capable of independent living Financial means General fund of knowledge Physical Health Supportive family/friends   PATIENT IDENTIFIED PROBLEMS: Patient unable to provide goals for treatment  Substance Abuse  Psychosis                 DISCHARGE CRITERIA:  Ability to meet basic life and health needs Adequate post-discharge living arrangements Improved stabilization in mood, thinking, and/or behavior Medical problems require only outpatient monitoring Motivation to continue treatment in a less acute level of care Need for constant or close observation no longer present Reduction of life-threatening or endangering symptoms to within safe limits Safe-care adequate arrangements made Verbal commitment to aftercare and medication compliance Withdrawal symptoms are absent or subacute and managed without 24-hour nursing intervention  PRELIMINARY DISCHARGE PLAN: Outpatient therapy  PATIENT/FAMILY INVOLVEMENT: This treatment plan has been presented to and reviewed with the patient, Tamara Robinson.  The patient and family have been given the opportunity to ask questions and make suggestions.  Annia Friendly, RN 03/21/2019, 1:54 PM

## 2019-03-21 NOTE — ED Notes (Signed)
Transported to [facility] by [facility]. One hospital bag with clothes, no shoes, no ID, no wallet, no cell phone, just clothes, given to [facility]. Pt taken out in wheel chair because she was sleepy.

## 2019-03-21 NOTE — Progress Notes (Signed)
Nursing Progress Note: 7p-7a D: Pt currently presents with a anxious/confused/paranoid/preoccuppied/grandiose affect and behavior. Interacting minimally with the milieu. Pt reports poor sleep during the previous night with current medication regimen.  A: Pt provided with medications per providers orders. Pt's labs and vitals were monitored throughout the night. Pt supported emotionally and encouraged to express concerns and questions. Pt educated on medications.  R: Pt's safety ensured with 15 minute and environmental checks. Pt currently denies SI, HI, and AVH. Pt verbally contracts to seek staff if SI,HI, or AVH occurs and to consult with staff before acting on any harmful thoughts. Will continue to monitor.   [place] NOVEL CORONAVIRUS (COVID-19) DAILY CHECK-OFF SYMPTOMS - answer yes or no to each - every day NO YES  Have you had a fever in the past 24 hours?  . Fever (Temp > 37.80C / 100F) X   Have you had any of these symptoms in the past 24 hours? . New Cough .  Sore Throat  .  Shortness of Breath .  Difficulty Breathing .  Unexplained Body Aches   X   Have you had any one of these symptoms in the past 24 hours not related to allergies?   . Runny Nose .  Nasal Congestion .  Sneezing   X   If you have had runny nose, nasal congestion, sneezing in the past 24 hours, has it worsened?  X   EXPOSURES - check yes or no X   Have you traveled outside the state in the past 14 days?  X   Have you been in contact with someone with a confirmed diagnosis of COVID-19 or PUI in the past 14 days without wearing appropriate PPE?  X   Have you been living in the same home as a person with confirmed diagnosis of COVID-19 or a PUI (household contact)?    X   Have you been diagnosed with COVID-19?    X              What to do next: Answered NO to all: Answered YES to anything:   Proceed with unit schedule Follow the [facility] Inpatient Flowsheet.

## 2019-03-21 NOTE — ED Notes (Signed)
Pt presents as paranoid. Requested a shower, but the did not want to do it. Agreed to take medications, but this writer had difficulty getting her to actually take them. She did take them.

## 2019-03-22 MED ORDER — CLONAZEPAM 0.5 MG PO TABS
0.5000 mg | ORAL_TABLET | Freq: Every day | ORAL | Status: DC
  Administered 2019-03-22 – 2019-03-24 (×3): 0.5 mg via ORAL
  Filled 2019-03-22 (×4): qty 1

## 2019-03-22 MED ORDER — CLONAZEPAM 0.5 MG PO TABS
0.5000 mg | ORAL_TABLET | Freq: Every day | ORAL | Status: DC
  Administered 2019-03-22 – 2019-03-23 (×2): 0.5 mg via ORAL
  Filled 2019-03-22: qty 1

## 2019-03-22 NOTE — Progress Notes (Signed)
Nursing Progress Note: 7p-7a D: Pt currently presents with a preoccupied/paranoid/confused affect and behavior. Not interacting with the milieu. Pt reports good sleep during the previous night with current medication regimen.  A: Pt provided with medications per providers orders. Pt's labs and vitals were monitored throughout the night. Pt supported emotionally and encouraged to express concerns and questions. Pt educated on medications.  R: Pt's safety ensured with 15 minute and environmental checks. Pt currently denies SI, HI, and AVH. Pt verbally contracts to seek staff if SI,HI, or AVH occurs and to consult with staff before acting on any harmful thoughts. Will continue to monitor     [place] NOVEL CORONAVIRUS (COVID-19) DAILY CHECK-OFF SYMPTOMS - answer yes or no to each - every day NO YES  Have you had a fever in the past 24 hours?  . Fever (Temp > 37.80C / 100F) X   Have you had any of these symptoms in the past 24 hours? . New Cough .  Sore Throat  .  Shortness of Breath .  Difficulty Breathing .  Unexplained Body Aches   X   Have you had any one of these symptoms in the past 24 hours not related to allergies?   . Runny Nose .  Nasal Congestion .  Sneezing   X   If you have had runny nose, nasal congestion, sneezing in the past 24 hours, has it worsened?  X   EXPOSURES - check yes or no X   Have you traveled outside the state in the past 14 days?  X   Have you been in contact with someone with a confirmed diagnosis of COVID-19 or PUI in the past 14 days without wearing appropriate PPE?  X   Have you been living in the same home as a person with confirmed diagnosis of COVID-19 or a PUI (household contact)?    X   Have you been diagnosed with COVID-19?    X              What to do next: Answered NO to all: Answered YES to anything:   Proceed with unit schedule Follow the [facility] Inpatient Flowsheet.     
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
## 2019-03-22 NOTE — [facility] Suicide Risk Assessment (Signed)
[facility] Admission Suicide Risk Assessment   Nursing information obtained from:  Patient, Review of record Demographic factors:  NA Current Mental Status:  NA Loss Factors:  NA Historical Factors:  NA Risk Reduction Factors:  Responsible for children under 49 years of age, Sense of responsibility to family, Religious beliefs about death, Living with another person, especially a relative, Positive social support, Positive therapeutic relationship, Positive coping skills or problem solving skills  Total Time spent with patient: 45 minutes Principal Problem: Drug-induced psychosis Diagnosis:  Active Problems:   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  World Pharmacologist [facility]). Score between 0-7:  no or low risk or alcohol related problems. Score between 8-15:  moderate risk of alcohol related problems. Score between 16-19:  high risk of alcohol related problems. Score 20 or above:  warrants further diagnostic evaluation for alcohol dependence and treatment.   CLINICAL FACTORS:   Alcohol/Substance Abuse/Dependencies Musculoskeletal: Strength & Muscle Tone: within normal limits Gait & Station: normal Patient leans: N/A  Psychiatric Specialty Exam: Physical Exam  Nursing note and vitals reviewed. Constitutional: She appears well-developed and well-nourished.    Review of Systems  Constitutional: Negative.   Eyes: Negative.   Cardiovascular: Negative.   Gastrointestinal: Negative.   Genitourinary: Negative.   Neurological: Negative.   Endo/Heme/Allergies: Negative.     There were no vitals taken for this visit.There is no height or weight on file to calculate BMI.  General Appearance: Casual   Eye Contact:  Good  Speech:  Clear and Coherent  Volume:  Increased  Mood:  Anxious, Dysphoric and mixed  Affect:  Congruent and Labile  Thought Process:  Irrelevant and Descriptions of Associations: Circumstantial  Orientation:  Full (Time, Place, and Person)  Thought Content:  Delusions  Suicidal Thoughts:  No  Homicidal Thoughts:  No  Memory:  Immediate;   Poor  Judgement:  Impaired  Insight:  Lacking  Psychomotor Activity:  Decreased  Concentration:  Concentration: Fair  Recall:  [employer] of Knowledge:  Fair  Language:  Fair  Akathisia:  Negative  Handed:  Right  AIMS (if indicated):     Assets:  Leisure Time Physical Health Resilience  ADL's:  Intact  Cognition:  WNL  Sleep:  Number of Hours: 6.75     COGNITIVE FEATURES THAT CONTRIBUTE TO RISK:  Polarized thinking    SUICIDE RISK:   Mild:  Suicidal ideation of limited frequency, intensity, duration, and specificity.  There are no identifiable plans, no associated intent, mild dysphoria and related symptoms, good self-control (both objective and subjective assessment), few other risk factors, and identifiable protective factors, including available and accessible social support.  PLAN OF CARE: see eval  I certify that inpatient services furnished can reasonably be expected to improve the patient's condition.   Johnn Hai, MD 03/22/2019, 10:52 AM

## 2019-03-22 NOTE — H&P (Signed)
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  She is a high dose and heavy cannabis abuser who was brought to the emergency department by EMS, she was found outside her home completely nude and combative and required restraints she also endorsed suicidal thoughts and again she acknowledges the high-dose cannabis usage but denied that it was a synthetic form. Further symptoms including marital discord by her admission, insomnia, and even hallucinations while impaired.  She also acknowledged benzodiazepine abuse she has been receiving 60 mg of clonazepam monthly for period of time but over the last 9270-month it was decreased to 45 mg a day her drug screen is negative for clonazepam she cannot recall when she last took it so this argues for the possibility of some component of benzodiazepine withdrawal as a component of her psychotic presentation. Shortly after her initial assessment on 8/7 she became combative and violent with staff, was not able to be verbally redirected. IM medications were used, By the time she came to our floor she continued to be agitated somewhat disorganized and expressing intense anxiety and paranoia she was given IM lorazepam has improved somewhat and she did sleep last night. We have excepted that she has a psychotic condition related to cannabis and possibly some component of benzodiazepine  withdrawal.  At the present time she is alert oriented to person place situation time and date but not exact date she is cooperative with interview process but anxious and paranoid believes people are "outside in the hall talking about her" to clarify she does not have auditory hallucinations but rather hears the routine voices of other patients and staff and believes they are focused on her.  Associated Signs/Symptoms: Depression Symptoms:  psychomotor agitation, (Hypo) Manic Symptoms:  Delusions, Distractibility, Hallucinations, Anxiety Symptoms:  Panic Symptoms, Psychotic Symptoms:  Paranoia, PTSD Symptoms: NA Total Time spent with patient: 45 minutes  Past Psychiatric History: neg  Is the patient at risk to self? Yes.    Has the patient been a risk to self in the past 6 months? No.  Has the patient been a risk to self within the distant past? No.  Is the patient a risk to others? No.  Has the patient been a risk to others in the past 6 months? No.  Has the patient been a risk to others within the distant past? No.   Prior Inpatient Therapy:   Prior Outpatient Therapy:    Alcohol Screening: Patient refused Alcohol Screening Tool: Yes Substance Abuse History in the last 12 months:  Yes.   Consequences of Substance Abuse: NA Previous Psychotropic Medications: Yes  Psychological Evaluations: No  Past Medical History:  Past Medical History:  Diagnosis Date  . Allergy   . Asthma   . Frequent headaches   . GAD (generalized anxiety disorder)   . GERD (gastroesophageal reflux disease)   . Hypertension     Past Surgical History:  Procedure Laterality Date  . ABDOMINAL HYSTERECTOMY  2006   Family History: History reviewed.  No pertinent family history. Family Psychiatric  History: n/a Tobacco Screening: Have you used any form of tobacco in the last 30 days? (Cigarettes, Smokeless Tobacco, Cigars, and/or Pipes): Patient Refused Screening Social History:  Social History    Substance and Sexual Activity  Alcohol Use Yes   Comment: weeky     Social History   Substance and Sexual Activity  Drug Use Yes  . Types: Marijuana    Additional Social History:                           Allergies:  No Known Allergies Lab Results: No results found for this or any previous visit (from the past 48 hour(s)).  Blood Alcohol level:  Lab Results  Component Value Date   ETH <10 03/19/2019   ETH <10 03/18/2019    Metabolic Disorder Labs:  Lab Results  Component Value Date   HGBA1C 5.8 08/19/2016   No results found for: PROLACTIN Lab Results  Component Value Date   CHOL 205 (H) 08/19/2016   TRIG 70.0 08/19/2016   HDL 68.60 08/19/2016   CHOLHDL 3 08/19/2016   VLDL 14.0 08/19/2016   LDLCALC 122 (H) 08/19/2016    Current Medications: Current Facility-Administered Medications  Medication Dose Route Frequency Provider Last Rate Last Dose  . acetaminophen (TYLENOL) tablet 650 mg  650 mg Oral Q6H PRN Malvin JohnsFarah, Brianny Soulliere, MD      . alum & mag hydroxide-simeth (MAALOX/MYLANTA) 200-200-20 MG/5ML suspension 30 mL  30 mL Oral Q4H PRN Malvin JohnsFarah, Synthia Fairbank, MD      . carbamazepine (TEGRETOL) chewable tablet 100 mg  100 mg Oral TID Malvin JohnsFarah, Nandika Stetzer, MD   100 mg at 03/22/19 [id number]  . carvedilol (COREG) tablet 25 mg  25 mg Oral BID WC Malvin JohnsFarah, Mysti Haley, MD   25 mg at 03/22/19 0829  . clonazePAM (KLONOPIN) tablet 0.5 mg  0.5 mg Oral Daily Malvin JohnsFarah, Deneshia Zucker, MD      . clonazePAM Scarlette Calico(KLONOPIN) tablet 0.5 mg  0.5 mg Oral QHS Malvin JohnsFarah, Marques Ericson, MD      . haloperidol (HALDOL) tablet 5 mg  5 mg Oral Q6H PRN Malvin JohnsFarah, Chanta Bauers, MD   5 mg at 03/22/19 0703   Or  . haloperidol lactate (HALDOL) injection 10 mg  10 mg Intramuscular Q6H PRN Malvin JohnsFarah, Brandis Matsuura, MD      . magnesium hydroxide (MILK OF MAGNESIA) suspension 30 mL  30 mL Oral Daily PRN Malvin JohnsFarah, Daniela Hernan, MD      . nicotine (NICODERM CQ - dosed in mg/24 hours) patch 21 mg  21 mg Transdermal Daily Malvin JohnsFarah, Juanna Pudlo, MD      . risperiDONE (RISPERDAL) tablet 3 mg  3 mg Oral  BID Malvin JohnsFarah, Marq Rebello, MD   3 mg at 03/22/19 [id number]  . ziprasidone (GEODON) injection 20 mg  20 mg Intramuscular Q6H PRN Malvin JohnsFarah, Andrw Mcguirt, MD       PTA Medications: Medications Prior to Admission  Medication Sig Dispense Refill Last Dose  . albuterol (VENTOLIN HFA) 108 (90 Base) MCG/ACT inhaler Inhale 2 puffs into the lungs every 6 (six) hours as needed for wheezing or shortness of breath. (Patient not taking: Reported on 03/18/2019) 1 Inhaler 0   . atenolol (TENORMIN) 25 MG tablet TAKE 1 TABLET BY MOUTH EVERY DAY (Patient not taking: Reported on 03/18/2019) 90 tablet 1   . clonazePAM (KLONOPIN) 1 MG tablet Take 1/2 tablet by mouth every morning and 1 tablet by mouth every evening as needed for anxiety. (Patient not taking: Reported  on 03/18/2019) 45 tablet 0   . doxylamine, Sleep, (UNISOM) 25 MG tablet Take 75 mg by mouth at bedtime as needed for sleep.     Marland Kitchen. HYDROcodone-acetaminophen (NORCO/VICODIN) 5-325 MG tablet Take 2 tablets by mouth every 4 (four) hours as needed. (Patient not taking: Reported on 03/18/2019) 10 tablet 0   . omeprazole (PRILOSEC) 40 MG capsule TAKE ONE CAPSULE BY MOUTH EVERY DAY (Patient not taking: Reported on 03/18/2019) 90 capsule 3   . ondansetron (ZOFRAN ODT) 4 MG disintegrating tablet Take 1 tablet (4 mg total) by mouth every 8 (eight) hours as needed for nausea or vomiting. (Patient not taking: Reported on 03/18/2019) 20 tablet 0   . PULMICORT FLEXHALER 180 MCG/ACT inhaler INHALE 1 PUFF INTO THE LUNGS 2 (TWO) TIMES DAILY. (Patient not taking: Reported on 03/18/2019) 1 each 5   . triamcinolone (KENALOG) 0.1 % paste Use as directed 1 application in the mouth or throat 2 (two) times daily. (Patient not taking: Reported on 03/18/2019) 5 g 0   . triamcinolone ointment (KENALOG) 0.5 % Apply 1 application topically 2 (two) times daily. (Patient not taking: Reported on 03/18/2019) 30 g 0     Musculoskeletal: Strength & Muscle Tone: within normal limits Gait & Station: normal Patient leans:  N/A  Psychiatric Specialty Exam: Physical Exam  Nursing note and vitals reviewed. Constitutional: She appears well-developed and well-nourished.    Review of Systems  Constitutional: Negative.   Eyes: Negative.   Cardiovascular: Negative.   Gastrointestinal: Negative.   Genitourinary: Negative.   Neurological: Negative.   Endo/Heme/Allergies: Negative.     There were no vitals taken for this visit.There is no height or weight on file to calculate BMI.  General Appearance: Casual  Eye Contact:  Good  Speech:  Clear and Coherent  Volume:  Increased  Mood:  Anxious, Dysphoric and mixed  Affect:  Congruent and Labile  Thought Process:  Irrelevant and Descriptions of Associations: Circumstantial  Orientation:  Full (Time, Place, and Person)  Thought Content:  Delusions  Suicidal Thoughts:  No  Homicidal Thoughts:  No  Memory:  Immediate;   Poor  Judgement:  Impaired  Insight:  Lacking  Psychomotor Activity:  Decreased  Concentration:  Concentration: Fair  Recall:  Fair  Fund of Knowledge:  Fair  Language:  Fair  Akathisia:  Negative  Handed:  Right  AIMS (if indicated):     Assets:  Leisure Time Physical Health Resilience  ADL's:  Intact  Cognition:  WNL  Sleep:  Number of Hours: 6.75    Treatment Plan Summary: Daily contact with patient to assess and evaluate symptoms and progress in treatment and Medication management  Observation Level/Precautions:  15 minute checks  Laboratory:  UDS  Psychotherapy: Reality based rehab based  Medications: Continue Risperdal/will taper clonazepam and Tegretol  Consultations: Medically cleared  Discharge Concerns: Longer-term sobriety  Estimated LOS: 5-7  Other: Axis I cannabis induced psychosis rule out component of clonazepam withdrawal psychosis/Axis II deferred Axis III medically stable   Physician Treatment Plan for Primary Diagnosis:  Begin antipsychotic therapy, begin taper regimen Long Term Goal(s): Improvement in  symptoms so as ready for discharge  Short Term Goals: Ability to verbalize feelings will improve, Ability to disclose and discuss suicidal ideas, Ability to demonstrate self-control will improve, Ability to identify and develop effective coping behaviors will improve, Ability to maintain clinical measurements within normal limits will improve and Compliance with prescribed medications will improve  Physician Treatment Plan for Secondary Diagnosis: Active Problems:  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    Johnn Hai, MD 8/11/202010:42 AM

## 2019-03-23 NOTE — Tx Team (Signed)
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 acetaminophen (TYLENOL) tablet 650 mg  650 mg Oral Q6H PRN Johnn Hai, MD   650 mg at 03/23/19 1227  . alum & mag hydroxide-simeth (MAALOX/MYLANTA) 200-200-20 MG/5ML suspension 30 mL  30 mL Oral Q4H PRN Johnn Hai, MD   30 mL at 03/22/19 1730  . carbamazepine (TEGRETOL) chewable tablet 100 mg  100 mg Oral TID Johnn Hai, MD   100 mg at 03/23/19 1215  . carvedilol (COREG) tablet 25 mg  25 mg Oral BID WC Johnn Hai, MD   25 mg at 03/23/19 0755  . clonazePAM (KLONOPIN) tablet 0.5 mg  0.5 mg Oral Daily Johnn Hai, MD   0.5 mg at 03/23/19 0756  . clonazePAM (KLONOPIN) tablet 0.5 mg  0.5 mg Oral QHS Johnn Hai, MD   0.5 mg at 03/22/19 2108  . haloperidol (HALDOL) tablet 5 mg  5 mg Oral Q6H PRN Johnn Hai, MD   5 mg at 03/23/19 1215   Or  . haloperidol lactate (HALDOL) injection 10 mg  10 mg Intramuscular Q6H PRN Johnn Hai, MD      . magnesium hydroxide (MILK OF MAGNESIA) suspension 30 mL  30 mL Oral Daily PRN Johnn Hai, MD      . nicotine (NICODERM CQ - dosed in mg/24 hours) patch 21 mg  21 mg Transdermal Daily Johnn Hai, MD   21 mg at 03/23/19 0755  . risperiDONE (RISPERDAL) tablet 3 mg  3 mg Oral BID Johnn Hai, MD   3 mg at 03/23/19 0755  . ziprasidone (GEODON) injection 20 mg  20 mg Intramuscular Q6H PRN Johnn Hai, MD       PTA Medications: Medications Prior to Admission  Medication Sig Dispense Refill Last Dose  . albuterol (VENTOLIN HFA) 108 (90 Base) MCG/ACT inhaler Inhale 2 puffs  into the lungs every 6 (six) hours as needed for wheezing or shortness of breath. (Patient not taking: Reported on 03/18/2019) 1 Inhaler 0   . atenolol (TENORMIN) 25 MG tablet TAKE 1 TABLET BY MOUTH EVERY DAY (Patient not taking: Reported on 03/18/2019) 90 tablet 1   . clonazePAM (KLONOPIN) 1 MG tablet Take 1/2 tablet by mouth every morning and 1 tablet by mouth every evening as needed for anxiety. (Patient not taking: Reported on 03/18/2019) 45 tablet 0   . doxylamine, Sleep, (UNISOM) 25 MG tablet Take 75 mg by mouth at bedtime as needed for sleep.     Marland Kitchen HYDROcodone-acetaminophen (NORCO/VICODIN) 5-325 MG tablet Take 2 tablets by mouth every 4 (four) hours as needed. (Patient not taking: Reported on 03/18/2019) 10 tablet 0   . omeprazole (PRILOSEC) 40 MG capsule TAKE ONE CAPSULE BY MOUTH EVERY DAY (Patient not taking: Reported on 03/18/2019) 90 capsule 3   . ondansetron (ZOFRAN ODT) 4 MG disintegrating tablet Take 1 tablet (4 mg total) by mouth every 8 (eight) hours as needed for nausea or vomiting. (Patient not taking: Reported on 03/18/2019) 20 tablet 0   . PULMICORT FLEXHALER 180 MCG/ACT inhaler INHALE 1 PUFF INTO THE LUNGS 2 (TWO) TIMES DAILY. (Patient not taking: Reported on 03/18/2019) 1 each 5   .  triamcinolone (KENALOG) 0.1 % paste Use as directed 1 application in the mouth or throat 2 (two) times daily. (Patient not taking: Reported on 03/18/2019) 5 g 0   . triamcinolone ointment (KENALOG) 0.5 % Apply 1 application topically 2 (two) times daily. (Patient not taking: Reported on 03/18/2019) 30 g 0     Patient Stressors: Substance abuse  Patient Strengths: Average or above average intelligence Capable of independent living Astronomerinancial means General fund of knowledge Physical Health Supportive family/friends  Treatment Modalities: Medication Management, Group therapy, Case management,  1 to 1 session with clinician, Psychoeducation, Recreational therapy.   Physician Treatment Plan for Primary Diagnosis:  <principal problem not specified> Long Term Goal(s): Improvement in symptoms so as ready for discharge Improvement in symptoms so as ready for discharge   Short Term Goals: Ability to verbalize feelings will improve Ability to disclose and discuss suicidal ideas Ability to demonstrate self-control will improve Ability to identify and develop effective coping behaviors will improve Ability to maintain clinical measurements within normal limits will improve Compliance with prescribed medications will improve Ability to disclose and discuss suicidal ideas Ability to demonstrate self-control will improve Ability to identify and develop effective coping behaviors will improve Ability to maintain clinical measurements within normal limits will improve  Medication Management: Evaluate patient's response, side effects, and tolerance of medication regimen.  Therapeutic Interventions: 1 to 1 sessions, Unit Group sessions and Medication administration.  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)   Bipolar I disorder, single manic episode, severe, with psychosis (HCC)  Long Term Goal(s): Improvement in symptoms so as ready for discharge Improvement in symptoms so as ready for discharge   Short Term Goals: Ability to verbalize feelings will improve Ability to disclose and discuss suicidal ideas Ability to demonstrate self-control will improve Ability to identify and develop effective coping behaviors will improve Ability to maintain clinical measurements within normal limits will improve Compliance with prescribed medications will improve Ability to disclose and discuss suicidal ideas Ability to demonstrate self-control will improve Ability to identify and develop effective coping behaviors will improve Ability to maintain clinical measurements within normal limits  will improve     Medication Management: Evaluate patient's response, side effects, and tolerance of medication regimen.  Therapeutic Interventions: 1 to 1 sessions, Unit Group sessions and Medication administration.  Evaluation of Outcomes: Progressing   RN Treatment Plan for Primary Diagnosis: <principal problem not specified> Long Term Goal(s): Knowledge of disease and therapeutic regimen to maintain health will improve  Short Term Goals: Ability to participate in decision making will improve, Ability to verbalize feelings will improve, Ability to disclose and discuss suicidal ideas, Ability to identify and develop effective coping behaviors will improve and Compliance with prescribed medications will improve  Medication Management: RN will administer medications as ordered by provider, will assess and evaluate patient's response and provide education to patient for prescribed medication. RN will report any adverse and/or side effects to prescribing provider.  Therapeutic Interventions: 1 on 1 counseling sessions, Psychoeducation, Medication administration, Evaluate responses to treatment, Monitor vital signs and CBGs as ordered, Perform/monitor CIWA, COWS, AIMS and Fall Risk screenings as ordered, Perform wound care treatments as ordered.  Evaluation of Outcomes: Progressing   LCSW Treatment Plan for Primary Diagnosis: <principal problem not specified> Long Term Goal(s): Safe transition to appropriate next level of care at discharge, Engage patient in therapeutic group addressing interpersonal concerns.  Short Term  Goals: Engage patient in aftercare planning with referrals and resources and Increase skills for wellness and recovery  Therapeutic Interventions: Assess for all discharge needs, 1 to 1 time with Social worker, Explore available resources and support systems, Assess for adequacy in community support network, Educate family and significant other(s) on suicide prevention,  Complete Psychosocial Assessment, Interpersonal group therapy.  Evaluation of Outcomes: Progressing   Progress in Treatment: Attending groups: Yes. Participating in groups: Yes. Taking medication as prescribed: Yes. Toleration medication: Yes. Family/Significant other contact made: No, will contact:  pt's husband or pt's daughter Patient understands diagnosis: Yes. Discussing patient identified problems/goals with staff: Yes. Medical problems stabilized or resolved: Yes. Denies suicidal/homicidal ideation: Yes. Issues/concerns per patient self-inventory: No. Other:   New problem(s) identified: No, Describe:  None  New Short Term/Long Term Goal(s): Medication stabilization, elimination of SI thoughts, and development of a comprehensive mental wellness plan.   Patient Goals:    Discharge Plan or Barriers: Patient may be going back home with her husband. [facility] and [facility]. CSW will continue to access with the patient.   Reason for Continuation of Hospitalization: Anxiety Hallucinations Medication stabilization  Estimated Length of Stay: 1-2 days  Attendees: Patient: 03/23/2019  Physician: Dr. Malvin JohnsBrian Farah, MD 03/23/2019  Nursing: Luanne BrasRoni, RN 03/23/2019   RN Care Manager: 03/23/2019  Social Worker: Stephannie PetersJasmine Naeema Patlan, LCSW 03/23/2019   Recreational Therapist:  03/23/2019   Other:  03/23/2019  Other:  03/23/2019  Other: 03/23/2019      Scribe for Treatment Team: Delphia GratesJasmine M Vaughan Garfinkle, LCSW 03/23/2019 2:29 PM

## 2019-03-23 NOTE — Progress Notes (Signed)
Nursing Progress Note: 7p-7a D: Pt currently presents with a preoccupied/paranoid/confused affect and behavior. Not interacting with the milieu. Pt reports good sleep during the previous night with current medication regimen.  A: Pt provided with medications per providers orders. Pt's labs and vitals were monitored throughout the night. Pt supported emotionally and encouraged to express concerns and questions. Pt educated on medications.  R: Pt's safety ensured with 15 minute and environmental checks. Pt currently denies SI, HI, and AVH. Pt verbally contracts to seek staff if SI,HI, or AVH occurs and to consult with staff before acting on any harmful thoughts. Will continue to monitor     [place] NOVEL CORONAVIRUS (COVID-19) DAILY CHECK-OFF SYMPTOMS - answer yes or no to each - every day NO YES  Have you had a fever in the past 24 hours?  . Fever (Temp > 37.80C / 100F) X   Have you had any of these symptoms in the past 24 hours? . New Cough .  Sore Throat  .  Shortness of Breath .  Difficulty Breathing .  Unexplained Body Aches   X   Have you had any one of these symptoms in the past 24 hours not related to allergies?   . Runny Nose .  Nasal Congestion .  Sneezing   X   If you have had runny nose, nasal congestion, sneezing in the past 24 hours, has it worsened?  X   EXPOSURES - check yes or no X   Have you traveled outside the state in the past 14 days?  X   Have you been in contact with someone with a confirmed diagnosis of COVID-19 or PUI in the past 14 days without wearing appropriate PPE?  X   Have you been living in the same home as a person with confirmed diagnosis of COVID-19 or a PUI (household contact)?    X   Have you been diagnosed with COVID-19?    X              What to do next: Answered NO to all: Answered YES to anything:   Proceed with unit schedule Follow the [facility] Inpatient Flowsheet.

## 2019-03-23 NOTE — Progress Notes (Signed)
[facility]  03/23/2019 10:52 AM Jennell CornerDawn Carandang  MRN:  [id number] Subjective:    Patient more stable in mood and affect seen in her room she is alert oriented to person place time day and situation but not exact date she is cooperative with interview process, she denies any thoughts of harming self or others she is embarrassed about her behavior and blames the drug use states she is never had bipolar type symptomatology or psychosis before.  Again she thinks she may be welcome at home but is not sure unable to reach her husband with either number given.  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 Allergy   . Asthma   . Frequent headaches   . GAD (generalized anxiety disorder)   . GERD (gastroesophageal reflux disease)   . Hypertension     Past Surgical History:  Procedure Laterality Date  . ABDOMINAL HYSTERECTOMY  2006   Family History: History reviewed. No pertinent family history.  Social History:  Social History   Substance and Sexual Activity  Alcohol Use Yes   Comment: weeky     Social History   Substance and Sexual Activity  Drug Use Yes  . Types: Marijuana    Social History   Socioeconomic History  . Marital status: Married    Spouse name: Not on file  . Number of children: Not on file  . Years of education: Not on file  . Highest education level: Not on file  Occupational History  . Not on file  Social Needs  . Financial resource strain: Not on file  . Food insecurity    Worry: Not on file    Inability: Not on file  . Transportation needs    Medical: Not on file    Non-medical: Not on file  Tobacco Use  . Smoking status: Current Every Day Smoker    Packs/day:  1.00  . Smokeless tobacco: Never Used  Substance and Sexual Activity  . Alcohol use: Yes    Comment: weeky  . Drug use: Yes    Types: Marijuana  . Sexual activity: Not on file  Lifestyle  . Physical activity    Days per week: Not on file    Minutes per session: Not on file  . Stress: Not on file  Relationships  . Social Musicianconnections    Talks on phone: Not on file    Gets together: Not on file    Attends religious service: Not on file    Active member of club or organization: Not on file    Attends meetings of clubs or organizations: Not on file    Relationship status: Not on file  Other Topics Concern  . Not on file  Social History Narrative  . Not on file   Additional Social History:                         Sleep: Good  Appetite:  Good  Current Medications: Current Facility-Administered Medications  Medication Dose Route Frequency Provider Last Rate Last Dose  . acetaminophen (TYLENOL) tablet 650 mg  650 mg Oral Q6H PRN Malvin JohnsFarah, Asia Favata, MD      . alum & mag hydroxide-simeth (MAALOX/MYLANTA) 200-200-20 MG/5ML  suspension 30 mL  30 mL Oral Q4H PRN Malvin JohnsFarah, Charlie Seda, MD   30 mL at 03/22/19 1730  . carbamazepine (TEGRETOL) chewable tablet 100 mg  100 mg Oral TID Malvin JohnsFarah, Mozella Rexrode, MD   100 mg at 03/23/19 0755  . carvedilol (COREG) tablet 25 mg  25 mg Oral BID WC Malvin JohnsFarah, Ninetta Adelstein, MD   25 mg at 03/23/19 0755  . clonazePAM (KLONOPIN) tablet 0.5 mg  0.5 mg Oral Daily Malvin JohnsFarah, Jayleen Afonso, MD   0.5 mg at 03/23/19 0756  . clonazePAM (KLONOPIN) tablet 0.5 mg  0.5 mg Oral QHS Malvin JohnsFarah, Caidon Foti, MD   0.5 mg at 03/22/19 2108  . haloperidol (HALDOL) tablet 5 mg  5 mg Oral Q6H PRN Malvin JohnsFarah, Juell Radney, MD   5 mg at 03/22/19 2108   Or  . haloperidol lactate (HALDOL) injection 10 mg  10 mg Intramuscular Q6H PRN Malvin JohnsFarah, Cale Bethard, MD      . magnesium hydroxide (MILK OF MAGNESIA) suspension 30 mL  30 mL Oral Daily PRN Malvin JohnsFarah, Payzlee Ryder, MD      . nicotine (NICODERM CQ - dosed in mg/24 hours) patch 21 mg  21 mg Transdermal Daily  Malvin JohnsFarah, Morrison Masser, MD   21 mg at 03/23/19 0755  . risperiDONE (RISPERDAL) tablet 3 mg  3 mg Oral BID Malvin JohnsFarah, Averie Hornbaker, MD   3 mg at 03/23/19 0755  . ziprasidone (GEODON) injection 20 mg  20 mg Intramuscular Q6H PRN Malvin JohnsFarah, Jalayia Bagheri, MD        Lab Results: No results found for this or any previous visit (from the past 48 hour(s)).  Blood Alcohol level:  Lab Results  Component Value Date   ETH <10 03/19/2019   ETH <10 03/18/2019    Metabolic Disorder Labs: Lab Results  Component Value Date   HGBA1C 5.8 08/19/2016   No results found for: PROLACTIN Lab Results  Component Value Date   CHOL 205 (H) 08/19/2016   TRIG 70.0 08/19/2016   HDL 68.60 08/19/2016   CHOLHDL 3 08/19/2016   VLDL 14.0 08/19/2016   LDLCALC 122 (H) 08/19/2016    Physical Findings: AIMS: Facial and Oral Movements Muscles of Facial Expression: None, normal Lips and Perioral Area: None, normal Jaw: None, normal Tongue: None, normal,Extremity Movements Upper (arms, wrists, hands, fingers): None, normal Lower (legs, knees, ankles, toes): None, normal, Trunk Movements Neck, shoulders, hips: None, normal, Overall Severity Severity of abnormal movements (highest score from questions above): None, normal Incapacitation due to abnormal movements: None, normal Patient's awareness of abnormal movements (rate only patient's report): No Awareness, Dental Status Current problems with teeth and/or dentures?: No Does patient usually wear dentures?: No  CIWA:    COWS:     Musculoskeletal: Strength & Muscle Tone: within normal limits Gait & Station: normal Patient leans: N/A  Psychiatric Specialty Exam: Physical Exam  ROS  Blood pressure 138/90, pulse 90, temperature 98.1 F (36.7 C), temperature source Oral, resp. rate 20.There is no height or weight on file to calculate BMI.  General Appearance: Casual  Eye Contact:  Good  Speech:  Clear and Coherent  Volume:  Normal  Mood:  Dysphoric  Affect:  Restricted  Thought  Process:  Coherent and Descriptions of Associations: Tangential  Orientation:  Full (Time, Place, and Person)  Thought Content:  Logical and Rumination  Suicidal Thoughts:  No  Homicidal Thoughts:  No  Memory:  Recent;   Fair  Judgement:  Fair  Insight:  Fair  Psychomotor Activity:  Normal  Concentration:  Concentration: Good and Attention Span: Fair  Recall:  Smiley Houseman of Knowledge:  Fair  Language:  Fair  Akathisia:  Negative  Handed:  Right  AIMS (if indicated):     Assets:  Physical Health Resilience  ADL's:  Intact  Cognition:  WNL  Sleep:  Number of Hours: 6.75     Treatment Plan Summary: Daily contact with patient to assess and evaluate symptoms and progress in treatment and Medication management  Continue current antipsychotic therapy reality-based therapy no change in current precautions probable discharge tomorrow still unable to reach family will discuss with team  Johnn Hai, MD 03/23/2019, 10:52 AM

## 2019-03-23 NOTE — BHH Group Notes (Signed)
Occupational Therapy Group Note  Date:  03/23/2019 Time:  2:51 PM  Group Topic/Focus:  Coping Skills  Participation Level:  Active  Participation Quality:  Attentive  Affect:  Flat  Cognitive:  Alert  Insight: Lacking  Engagement in Group:  Engaged  Modes of Intervention:  Activity, Discussion, Education and Socialization  Additional Comments:    S: I am so confused  O: Education given on coping skill facilitation through "popcorn questioning" with a beach ball. Pts are to answer questions on ball and facilitate discussion with other group members.  A: Pt presents to group with flat affect, attentive throughout group. She shares frequently how she feels confused and is asking questions like "will I be here forever" "will my husband come and get me" "is my family alive". Provided redirection and reassurance to pt at this time. She shares how she would like to control her drug use so it does not create family strain as it did.  P: OT group will be x1 per week while pt inpatient.   Zenovia Jarred, MSOT, OTR/L Behavioral Health OT/ Acute Relief OT PHP Office: [place] 03/23/2019, 2:51 PM

## 2019-03-24 MED ORDER — RISPERIDONE 3 MG PO TABS
3.0000 mg | ORAL_TABLET | Freq: Every day | ORAL | Status: DC
  Filled 2019-03-24: qty 1

## 2019-03-24 MED ORDER — RISPERIDONE 3 MG PO TABS: ORAL_TABLET | ORAL | 0 refills | Status: AC

## 2019-03-24 MED ORDER — CARBAMAZEPINE 100 MG PO CHEW: 100.0000 mg | CHEWABLE_TABLET | Freq: Three times a day (TID) | ORAL | 0 refills | Status: AC

## 2019-03-24 NOTE — BHH Suicide Risk Assessment (Signed)
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 rate 20.There is no height or weight on file to calculate BMI.  General Appearance: Casual  Eye Contact::  Good  Speech:  Clear and Coherent409  Volume:  Normal  Mood:  Euthymic  Affect:  Flat  Thought Process:  Coherent and Linear  Orientation:  Full (Time, Place, and Person)  Thought Content:  Rumination  Suicidal Thoughts:  No  Homicidal Thoughts:  No  Memory:  Immediate;   Good Recent;   Good Remote;   Fair  Judgement:  Fair  Insight:  Fair  Psychomotor Activity:  Normal  Concentration:  Good  Recall:  Good  Fund of Knowledge:Good  Language: Good  Akathisia:  Negative  Handed:  Right  AIMS (if indicated):     Assets:  Communication Skills Desire for Improvement  Sleep:  Number of Hours: 6.5  Cognition: WNL  ADL's:  Intact   Mental Status Per Nursing Assessment::   On Admission:  NA  Demographic Factors:  Caucasian  Loss Factors: Decrease in vocational status  Historical Factors: NA  Risk Reduction Factors:   Sense of responsibility to family, Religious beliefs about death, Living with another person, especially a relative and Positive social support  Continued Clinical Symptoms:  Dysthymia  Cognitive Features That  Contribute To Risk:  None    Suicide Risk:  Minimal: No identifiable suicidal ideation.  Patients presenting with no risk factors but with morbid ruminations; may be classified as minimal risk based on the severity of the depressive symptoms  Follow-up Information    [facility] Follow up on 03/31/2019.   Why: Telephonic hospital follow up appointment is Thursday, 8/20 at 10:00a.  The provider will contact you.  Contact information: [address] [place] [address] [phone]           Plan Of Care/Follow-up recommendations:  Activity:  full  Omarr Hann, MD 03/24/2019, 9:24 AM

## 2019-03-24 NOTE — Progress Notes (Signed)
Patient ID: Tamara Robinson, female   DOB: [birth year], 48 y.o.   MRN: [id number]  CSW met with patient as she asked for resources and a brochure regarding her aftercare services at [facility]. Patient also asked about her diagnosis and asked for resources about her diagnosis. CSW gave patient resources on [place] and her diagnosis.

## 2019-03-24 NOTE — BHH Suicide Risk Assessment (Signed)
[place] INPATIENT:  Family/Significant Other Suicide Prevention Education  Suicide Prevention Education:  Education Completed; Pt's husband, Tamara Robinson,  has been identified by the patient as the family member/significant other with whom the patient will be residing, and identified as the person(s) who will aid the patient in the event of a mental health crisis (suicidal ideations/suicide attempt).  With written consent from the patient, the family member/significant other has been provided the following suicide prevention education, prior to the and/or following the discharge of the patient.  The suicide prevention education provided includes the following:  Suicide risk factors  Suicide prevention and interventions  National Suicide Hotline telephone number  [facility] assessment telephone number  [facility] Emergency Assistance [place] and/or Residential Mobile Crisis Unit telephone number  Request made of family/significant other to:  Remove weapons (e.g., guns, rifles, knives), all items previously/currently identified as safety concern.    Remove drugs/medications (over-the-counter, prescriptions, illicit drugs), all items previously/currently identified as a safety concern.  The family member/significant other verbalizes understanding of the suicide prevention education information provided.  The family member/significant other agrees to remove the items of safety concern listed above.    CSW contacted pt's husband, Tamara Robinson. Pt's husband stated that he will not be able to pick her up until 5pm when he gets off of work. He stated that she can come home with him but he is concerned about a possible relapse. He stated that she has a big tendency not to take her medications and a tendency not to follow up with aftercare. Pt's husband stated that she could easily end back up in the hospital which is his biggest worry. Pt's husband stated that if the cops  have to come out again that they have stated that they will press charges. He asked about her medications and asked about her aftercare. He stated that he will be having a talk with her about staying with him.   Tamara Robinson 03/24/2019, 9:09 AM

## 2019-03-24 NOTE — Progress Notes (Signed)
  [facility] Adult Case Management Discharge Plan :  Will you be returning to the same living situation after discharge:  Yes,  home with husbadn At discharge, do you have transportation home?: Yes,  pt's husband at 5pm when he gets off of work Do you have the ability to pay for your medications: No.; [facility]  Release of information consent forms completed and in the chart;  Patient's signature needed at discharge.  Patient to Follow up at: Follow-up Information    [facility] Follow up on 03/31/2019.   Why: Telephonic hospital follow up appointment is Thursday, 8/20 at 10:00a.  The provider will contact you.  Contact information: [place] [address] [address] [phone]           Next level of care provider has access to [place] and Suicide Prevention discussed: Yes,  pt's husband  Have you used any form of tobacco in the last 30 days? (Cigarettes, Smokeless Tobacco, Cigars, and/or Pipes): Patient Refused Screening  Has patient been referred to the Quitline?: Patient refused referral  Patient has been referred for addiction treatment: Yes  [name], LCSW 03/24/2019, 9:44 AM

## 2019-03-24 NOTE — Discharge Summary (Signed)
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 No prior hospitalizations.  Past Medical History:  Past Medical History:  Diagnosis Date  . Allergy   . Asthma   . Frequent headaches   . GAD (generalized anxiety disorder)   . GERD (gastroesophageal reflux disease)   . Hypertension     Past Surgical History:  Procedure Laterality Date  . ABDOMINAL HYSTERECTOMY  2006   Family History: History reviewed. No pertinent family history. Family Psychiatric  History: Denies Social History:  Social History   Substance and Sexual Activity  Alcohol Use Yes   Comment: weeky     Social History   Substance and Sexual Activity  Drug Use Yes  . Types: Marijuana    Social History   Socioeconomic History  . Marital status: Married    Spouse name: Not on file  . Number of children: Not on file  . Years of education: Not on file  . Highest education level: Not on file  Occupational History  . Not on file  Social Needs  . Financial resource strain: Not on file  . Food insecurity    Worry: Not on file    Inability: Not on file  . Transportation needs    Medical: Not on file    Non-medical: Not on file  Tobacco Use  . Smoking status: Current Every Day Smoker    Packs/day: 1.00  . Smokeless tobacco: Never Used  Substance and Sexual Activity  . Alcohol use: Yes     Comment: weeky  . Drug use: Yes    Types: Marijuana  . Sexual activity: Not on file  Lifestyle  . Physical activity    Days per week: Not on file    Minutes per session: Not on file  . Stress: Not on file  Relationships  . Social Musicianconnections    Talks on phone: Not on file    Gets together: Not on file    Attends religious service: Not on file    Active member of club or organization: Not on file    Attends meetings of clubs or organizations: Not on file    Relationship status: Not on file  Other Topics Concern  . Not on file  Social History Narrative  . Not on file    Hospital Course:  From admission H&P: This is the first psychiatric admission for Tamara Robinson, a 48 year old patient admitted on an involuntary basis due to a cluster of bizarre and dangerous behaviors, propelled by a seemingly drug-induced psychosis.  She is a high dose and heavy cannabis abuser who was brought to the emergency department by EMS, she was found outside her home completely nude and combative and required restraints she also endorsed suicidal thoughts and again she acknowledges the high-dose cannabis usage but denied that it was a synthetic form. Further symptoms including marital discord by her admission, insomnia, and even hallucinations while impaired.  She also acknowledged benzodiazepine  abuse she has been receiving 60 mg of clonazepam monthly for period of time but over the last 53-month it was decreased to 45 mg a day her drug screen is negative for clonazepam she cannot recall when she last took it so this argues for the possibility of some component of benzodiazepine withdrawal as a component of her psychotic presentation. Shortly after her initial assessment on 8/7 she became combative and violent with staff, was not able to be verbally redirected. IM medications were used. By the time she came to our floor she continued to be agitated somewhat disorganized and expressing intense anxiety and paranoia she was  given IM lorazepam has improved somewhat and she did sleep last night. We have excepted that she has a psychotic condition related to cannabis and possibly some component of benzodiazepine withdrawal.  At the present time she is alert oriented to person place situation time and date but not exact date she is cooperative with interview process but anxious and paranoid believes people are "outside in the hall talking about her" to clarify she does not have auditory hallucinations but rather hears the routine voices of other patients and staff and believes they are focused on her.  Tamara Robinson was admitted for substance-induced psychosis. UDS was positive for THC. Patient admitted to abuse of benzodiazepines as well as heavy marijuana use. She remained on the [facility] unit for three days. Risperdal and Tegretol were started. Klonopin was tapered down and discontinued. She participated in group therapy on the unit. She responded well to treatment with no adverse effects reported. She has shown improved mood, affect, sleep, and interaction with more organized speech and behavior. She presents with euthymic affect, reports stable mood and expresses readiness for discharge. She acknowledges negative outcomes from her drug use and expresses motivation to remain abstinent from marijuana and benzodiazepines for her own wellbeing as well as her family. She denies any SI/HI/AVH and contracts for safety. She denies withdrawal symptoms. She is discharging on the medications listed below. She agrees to follow up at [facility] (see below). She is provided with prescriptions and medication samples upon discharge. Her husband is picking her up for discharge home.  Physical Findings: AIMS: Facial and Oral Movements Muscles of Facial Expression: None, normal Lips and Perioral Area: None, normal Jaw: None, normal Tongue: None, normal,Extremity Movements Upper (arms, wrists, hands, fingers): None, normal Lower (legs, knees, ankles,  toes): None, normal, Trunk Movements Neck, shoulders, hips: None, normal, Overall Severity Severity of abnormal movements (highest score from questions above): None, normal Incapacitation due to abnormal movements: None, normal Patient's awareness of abnormal movements (rate only patient's report): No Awareness, Dental Status Current problems with teeth and/or dentures?: No Does patient usually wear dentures?: No  CIWA:    COWS:     Musculoskeletal: Strength & Muscle Tone: within normal limits Gait & Station: normal Patient leans: N/A  Psychiatric Specialty Exam: Physical Exam  Nursing note and vitals reviewed. Constitutional: She is oriented to person, place, and time. She appears well-developed and well-nourished.  Cardiovascular: Normal rate.  Respiratory: Effort normal.  Neurological: She is alert and oriented to person, place, and time.    Review of Systems  Constitutional: Negative.   Psychiatric/Behavioral: Positive for substance abuse. Negative for depression, hallucinations and suicidal ideas. The patient is not nervous/anxious and does not have insomnia.     Blood pressure 128/66, pulse 76, temperature 98.1 F (36.7 C), temperature source Oral, resp. rate 20.There is no height or weight on file to  calculate BMI.  See MD's discharge SRA     Have you used any form of tobacco in the last 30 days? (Cigarettes, Smokeless Tobacco, Cigars, and/or Pipes): Patient Refused Screening  Has this patient used any form of tobacco in the last 30 days? (Cigarettes, Smokeless Tobacco, Cigars, and/or Pipes)  Yes, A prescription for an FDA-approved tobacco cessation medication was offered at discharge and the patient refused  Blood Alcohol level:  Lab Results  Component Value Date   [facility]   ETH <10 03/18/2019    Metabolic Disorder Labs:  Lab Results  Component Value Date   HGBA1C 5.8 08/19/2016   No results found for: PROLACTIN Lab Results  Component Value Date    CHOL 205 (H) 08/19/2016   TRIG 70.0 08/19/2016   HDL 68.60 08/19/2016   CHOLHDL 3 08/19/2016   VLDL 14.0 08/19/2016   LDLCALC 122 (H) 08/19/2016    See Psychiatric Specialty Exam and Suicide Risk Assessment completed by Attending Physician prior to discharge.  Discharge destination:  Home  Is patient on multiple antipsychotic therapies at discharge:  No   Has Patient had three or more failed trials of antipsychotic monotherapy by history:  No  Recommended Plan for Multiple Antipsychotic Therapies: NA   Allergies as of 03/24/2019   No Known Allergies     Medication List    STOP taking these medications   clonazePAM 1 MG tablet Commonly known as: KLONOPIN   HYDROcodone-acetaminophen 5-325 MG tablet Commonly known as: NORCO/VICODIN   triamcinolone 0.1 % paste Commonly known as: KENALOG   triamcinolone ointment 0.5 % Commonly known as: KENALOG     TAKE these medications     Indication  albuterol 108 (90 Base) MCG/ACT inhaler Commonly known as: Ventolin HFA Inhale 2 puffs into the lungs every 6 (six) hours as needed for wheezing or shortness of breath.  Indication: Chronic Obstructive Lung Disease   atenolol 25 MG tablet Commonly known as: TENORMIN TAKE 1 TABLET BY MOUTH EVERY DAY  Indication: High Blood Pressure Disorder   carbamazepine 100 MG chewable tablet Commonly known as: TEGRETOL Chew 1 tablet (100 mg total) by mouth 3 (three) times daily.  Indication: Manic-Depression   doxylamine (Sleep) 25 MG tablet Commonly known as: UNISOM Take 75 mg by mouth at bedtime as needed for sleep.  Indication: Trouble Sleeping   omeprazole 40 MG capsule Commonly known as: PRILOSEC TAKE ONE CAPSULE BY MOUTH EVERY DAY  Indication: Gastroesophageal Reflux Disease   ondansetron 4 MG disintegrating tablet Commonly known as: Zofran ODT Take 1 tablet (4 mg total) by mouth every 8 (eight) hours as needed for nausea or vomiting.  Indication: Nausea and Vomiting    Pulmicort Flexhaler 180 MCG/ACT inhaler Generic drug: budesonide INHALE 1 PUFF INTO THE LUNGS 2 (TWO) TIMES DAILY.  Indication: Chronic Obstructive Lung Disease   risperiDONE 3 MG tablet Commonly known as: RISPERDAL 1 at hs till gone  Indication: Schizophrenia      Follow-up Information    [facility] Follow up on 03/31/2019.   Why: Telephonic hospital follow up appointment is Thursday, 8/20 at 10:00a.  The provider will contact you.  Contact information: [address] [place] [id number] [phone]           Follow-up recommendations: Activity as tolerated. Diet as recommended by primary care physician. Keep all scheduled follow-up appointments as recommended.   Comments:   Patient is instructed to take all prescribed medications as recommended. Report any side effects or adverse reactions to  your outpatient psychiatrist. Patient is instructed to abstain from alcohol and illegal drugs while on prescription medications. In the event of worsening symptoms, patient is instructed to call the crisis hotline, 911, or go to the nearest emergency department for evaluation and treatment.  Signed: Aldean BakerJanet E Martavious Hartel, NP 03/24/2019, 12:14 PM

## 2019-03-24 NOTE — [facility] Group Notes (Signed)
[facility] LCSW Group Therapy Note  Date/Time: 03/24/2019 @ 1:00pm  Type of Therapy/Topic:  Group Therapy:  Feelings about Diagnosis  Participation Level:  Active   Mood: Pleasant   Description of Group:    This group will allow patients to explore their thoughts and feelings about diagnoses they have received. Patients will be guided to explore their level of understanding and acceptance of these diagnoses. Facilitator will encourage patients to process their thoughts and feelings about the reactions of others to their diagnosis, and will guide patients in identifying ways to discuss their diagnosis with significant others in their lives. This group will be process-oriented, with patients participating in exploration of their own experiences as well as giving and receiving support and challenge from other group members.   Therapeutic Goals: 1. Patient will demonstrate understanding of diagnosis as evidence by identifying two or more symptoms of the disorder:  2. Patient will be able to express two feelings regarding the diagnosis 3. Patient will demonstrate ability to communicate their needs through discussion and/or role plays  Summary of Patient Progress:   Patient was engaged and active throughout group therapy. Patient was teary throughout group and often stated that she was confused. Patient was able to stated that when she was diagnosed with a mental health diagnosis that she felt it was more the doctor's fault for giving her the diagnosis. Patient asked to leave the group halfway through and then came back about 2 minutes later to sit down.      Therapeutic Modalities:   Cognitive Behavioral Therapy Brief Therapy Feelings Identification   Ardelle Anton, LCSW

## 2019-03-24 NOTE — Progress Notes (Signed)
Patient ID: Tamara Robinson, female   DOB: 29-Nov-1970, 48 y.o.   MRN: [id number] Patient discharged to home self care in the presence of family.  Patient denies SI, HI and AVH upon discharge but complains of being confused a little bit.  Patient acknowledges understanding of all discharge instructions and receipt of personal belongings.

## 2019-04-20 ENCOUNTER — Other Ambulatory Visit: Payer: Self-pay | Admitting: Primary Care

## 2019-04-20 DIAGNOSIS — I10 Essential (primary) hypertension: Secondary | ICD-10-CM

## 2020-02-10 ENCOUNTER — Other Ambulatory Visit: Payer: Self-pay | Admitting: Internal Medicine

## 2020-02-10 DIAGNOSIS — Z1231 Encounter for screening mammogram for malignant neoplasm of breast: Secondary | ICD-10-CM

## 2020-02-22 ENCOUNTER — Ambulatory Visit: Payer: Self-pay

## 2020-02-24 ENCOUNTER — Ambulatory Visit
Admission: RE | Admit: 2020-02-24 | Discharge: 2020-02-24 | Disposition: A | Discharge status: Home / Self Care | Payer: Self-pay | Source: Ambulatory Visit | Attending: Internal Medicine | Admitting: Internal Medicine

## 2020-02-24 ENCOUNTER — Other Ambulatory Visit: Payer: Self-pay

## 2020-02-24 DIAGNOSIS — Z1231 Encounter for screening mammogram for malignant neoplasm of breast: Secondary | ICD-10-CM

## 2020-09-24 ENCOUNTER — Ambulatory Visit: Payer: Commercial Managed Care - PPO | Admitting: Podiatry

## 2020-09-24 ENCOUNTER — Other Ambulatory Visit: Payer: Self-pay

## 2020-09-24 ENCOUNTER — Ambulatory Visit (INDEPENDENT_AMBULATORY_CARE_PROVIDER_SITE_OTHER): Payer: Commercial Managed Care - PPO

## 2020-09-24 DIAGNOSIS — M659 Synovitis and tenosynovitis, unspecified: Secondary | ICD-10-CM

## 2020-09-24 DIAGNOSIS — S9000XA Contusion of unspecified ankle, initial encounter: Secondary | ICD-10-CM

## 2020-09-24 DIAGNOSIS — S9002XA Contusion of left ankle, initial encounter: Secondary | ICD-10-CM

## 2020-09-24 MED ORDER — BETAMETHASONE SOD PHOS & ACET 6 (3-3) MG/ML IJ SUSP
3.0000 mg | Freq: Once | INTRAMUSCULAR | Status: AC
  Administered 2020-09-24: 3 mg via INTRA_ARTICULAR

## 2020-09-24 MED ORDER — METHYLPREDNISOLONE 4 MG PO TBPK: ORAL_TABLET | ORAL | 0 refills | Status: AC

## 2020-09-24 NOTE — Progress Notes (Signed)
   Subjective:  50 y.o. female presenting today as a new patient for evaluation of bilateral ankle pain this been going on for approximately 3 weeks.  She states that it has improved somewhat over the last 3 weeks.  She denies a history of injury.  She presents for further treatment evaluation   Past Medical History:  Diagnosis Date  . Allergy   . Asthma   . Frequent headaches   . GAD (generalized anxiety disorder)   . GERD (gastroesophageal reflux disease)   . Hypertension      Objective / Physical Exam:  General:  The patient is alert and oriented x3 in no acute distress. Dermatology:  Skin is warm, dry and supple bilateral lower extremities. Negative for open lesions or macerations. Vascular:  Palpable pedal pulses bilaterally. No edema or erythema noted. Capillary refill within normal limits. Neurological:  Epicritic and protective threshold grossly intact bilaterally.  Musculoskeletal Exam:  Pain on palpation to the anterior lateral medial aspects of the patient's bilateral ankles. Mild edema noted. Range of motion within normal limits to all pedal and ankle joints bilateral. Muscle strength 5/5 in all groups bilateral.   Radiographic Exam:  Normal osseous mineralization. Joint spaces preserved. No fracture/dislocation/boney destruction.    Assessment: 1.  Possible acute gout bilateral ankles 2.  Synovitis of the ankles bilateral  Plan of Care:  1. Patient was evaluated. X-Rays reviewed.  2. Injection of 0.5 mL Celestone Soluspan injected in the patient's bilateral ankle joints.  3.  Recommend diet modifications reducing red meat and adjusting to a gout friendly diet 4.  Prescription for Medrol Dosepak, after completion of the Dosepak she may resume OTC Motrin 5.  Return to clinic in 3 weeks  *Homemaker.  Has a daughter in college   Felecia Shelling, [place] [facility]  Dr. Felecia Shelling, DPM    [address]                                         [place], [place] [address]                Office [phone]  Fax [phone]

## 2020-09-27 ENCOUNTER — Telehealth: Payer: Self-pay | Admitting: Podiatry

## 2020-09-27 NOTE — Telephone Encounter (Signed)
Patient is still in pain and wanted to know if she can take motrin. She wasn't sure if she should come back in to see you again before her next appt.

## 2020-09-28 ENCOUNTER — Telehealth: Payer: Self-pay | Admitting: Podiatry

## 2020-09-28 NOTE — Telephone Encounter (Signed)
Patient called and stated that the injection that were giving in both feet are not working. She is still in severe pain. What do you advise her to do?

## 2020-09-28 NOTE — Telephone Encounter (Signed)
Patient called inquiring about previous message that was sent to provider, no response but patient was scheduled for 10/01/20 to come in regarding the pain which is making it difficult for patient to walk without assistance

## 2020-10-01 ENCOUNTER — Ambulatory Visit: Payer: Commercial Managed Care - PPO | Admitting: Podiatry

## 2020-10-01 ENCOUNTER — Other Ambulatory Visit: Payer: Self-pay

## 2020-10-01 DIAGNOSIS — M659 Synovitis and tenosynovitis, unspecified: Secondary | ICD-10-CM

## 2020-10-01 MED ORDER — HYDROCODONE-ACETAMINOPHEN 5-325 MG PO TABS: 1.0000 | ORAL_TABLET | Freq: Four times a day (QID) | ORAL | 0 refills | Status: AC | PRN

## 2020-10-01 MED ORDER — BETAMETHASONE SOD PHOS & ACET 6 (3-3) MG/ML IJ SUSP
3.0000 mg | Freq: Once | INTRAMUSCULAR | Status: AC
  Administered 2020-10-01: 3 mg via INTRA_ARTICULAR

## 2020-10-01 MED ORDER — ONDANSETRON HCL 4 MG PO TABS: 4.0000 mg | ORAL_TABLET | Freq: Three times a day (TID) | ORAL | 0 refills | Status: AC | PRN

## 2020-10-01 MED ORDER — DICLOFENAC SODIUM 75 MG PO TBEC: 75.0000 mg | DELAYED_RELEASE_TABLET | Freq: Two times a day (BID) | ORAL | 1 refills | Status: AC

## 2020-10-01 NOTE — Progress Notes (Signed)
   Subjective:  50 y.o. female presenting today for follow-up evaluation of bilateral ankle pain.  Patient states that since last visit the pain has actually increased.  She states that the injections did not help.  Again she denies a history of any trauma or injury to the feet.  She has not been dealing with this pain for over 1 month now.  She presents for further treatment evaluation   Past Medical History:  Diagnosis Date  . Allergy   . Asthma   . Frequent headaches   . GAD (generalized anxiety disorder)   . GERD (gastroesophageal reflux disease)   . Hypertension      Objective / Physical Exam:  General:  The patient is alert and oriented x3 in no acute distress. Dermatology:  Skin is warm, dry and supple bilateral lower extremities. Negative for open lesions or macerations. Vascular:  Palpable pedal pulses bilaterally. No edema or erythema noted. Capillary refill within normal limits. Neurological:  Epicritic and protective threshold grossly intact bilaterally.  Musculoskeletal Exam:  Pain on palpation to the anterior lateral medial aspects of the patient's bilateral ankles. Range of motion within normal limits to all pedal and ankle joints bilateral. Muscle strength 5/5 in all groups bilateral.  There is no crepitus with range of motion to the ankle joints  Assessment: 1.  Possible acute gout bilateral ankles 2.  Synovitis of the ankles bilateral  Plan of Care:  1. Patient was evaluated.  I explained to the patient that I am uncertain exactly why she is experiencing such severe ankle pain.  She has no history of trauma and her circulation is intact.  There is no significant erythema or edema to the ankles.  Idiopathic onset. 2.  Patient is requesting another injection today to see if it helps alleviate her symptoms.  Injection of 0.5 mL Celestone Soluspan injected in the patient's bilateral ankle joints.  3.  Order placed for arthritic panel and uric acid levels 4.   Prescription for diclofenac 75 mg 2 times daily 5.  Prescription for hydrocodone 5/325 mg.  Patient has history of nausea with opioid medication.  Additional prescription for ondansetron 4 mg p.o. provided 6.  Return to clinic in 3 weeks  *Homemaker.  Has a daughter in college   Felecia Shelling, [place] [facility]  Dr. Felecia Shelling, DPM    [address]                                        [place], [place] [address]                Office [phone]  Fax [phone]

## 2020-10-17 LAB — ARTHRITIS PANEL
Anti Nuclear Antibody (ANA): NEGATIVE
Rheumatoid fact SerPl-aCnc: <10 IU/mL (ref <14.0)
Sed Rate: 19 mm/hr (ref 0–40)
Uric Acid: 3.6 mg/dL (ref 2.6–6.2)

## 2020-10-17 LAB — URIC ACID: Uric Acid, Serum: 4.1 mg/dL (ref 2.5–7.0)

## 2020-10-22 ENCOUNTER — Other Ambulatory Visit: Payer: Self-pay

## 2020-10-22 ENCOUNTER — Ambulatory Visit: Payer: Commercial Managed Care - PPO | Admitting: Podiatry

## 2020-10-22 DIAGNOSIS — M659 Synovitis and tenosynovitis, unspecified: Secondary | ICD-10-CM

## 2020-11-02 ENCOUNTER — Telehealth: Payer: Self-pay | Admitting: Podiatry

## 2020-11-02 NOTE — Telephone Encounter (Signed)
Patient has requested order for MRI, stated she was told someone would call for scheduling but has yet to do so, Please Advise

## 2020-11-11 NOTE — Progress Notes (Signed)
   Subjective:  50 y.o. female presenting today for follow-up evaluation of bilateral ankle pain.  Patient continues to have pain to the bilateral ankle joints.  She states that there is no improvement.  She continues to have pain and is unable to apply pressure to her feet.  This is drastically affecting her ability to walk and ambulate.  She presents for further treatment and evaluation  Past Medical History:  Diagnosis Date  . Allergy   . Asthma   . Frequent headaches   . GAD (generalized anxiety disorder)   . GERD (gastroesophageal reflux disease)   . Hypertension      Objective / Physical Exam:  General:  The patient is alert and oriented x3 in no acute distress. Dermatology:  Skin is warm, dry and supple bilateral lower extremities. Negative for open lesions or macerations. Vascular:  Palpable pedal pulses bilaterally. No edema or erythema noted. Capillary refill within normal limits. Neurological:  Epicritic and protective threshold grossly intact bilaterally.  Musculoskeletal Exam:  Pain on palpation to the anterior lateral medial aspects of the patient's bilateral ankles. Range of motion within normal limits to all pedal and ankle joints bilateral. Muscle strength 5/5 in all groups bilateral.  There is no crepitus with range of motion to the ankle joints  Assessment: 1.  Possible acute gout bilateral ankles 2.  Synovitis of the ankles bilateral  Plan of Care:  1. Patient was evaluated.  Patient has severe pain and tenderness to the bilateral ankles despite conservative treatment modalities. 2.  Today we are going to order MRI bilateral ankles.  Order placed 3.  Return to clinic after MRI to discuss results and further treatment options  *Homemaker.  Has a daughter in college   Felecia Shelling, [place] [facility]  Dr. Felecia Shelling, DPM    [address]                                        [place], [place] [address]                Office [phone]  Fax [phone]

## 2022-05-20 IMAGING — MG DIGITAL SCREENING BILAT W/ TOMO W/ CAD
6 of 10 series · 6 of 30 positions shown · non-contrast
Comparison: None.

CLINICAL DATA: Screening.

EXAM:
DIGITAL SCREENING BILATERAL MAMMOGRAM WITH TOMO AND CAD

[L MLO synth-2D (1 of 2)]
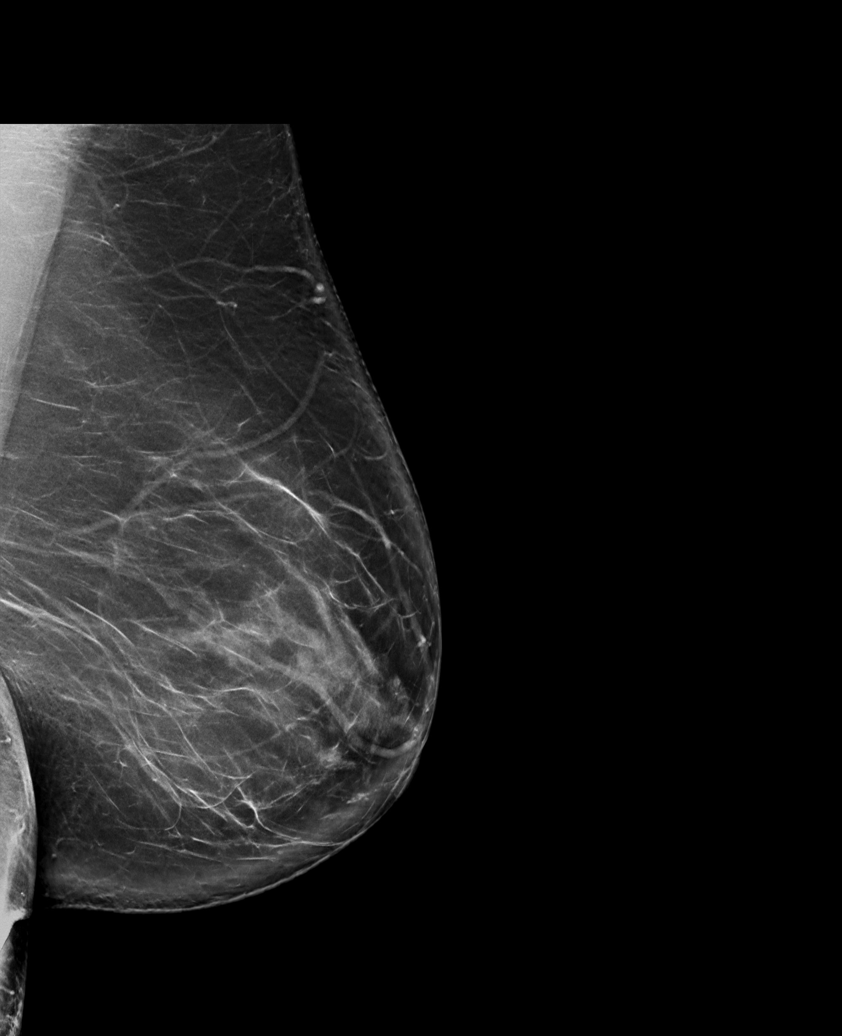

[R MLO synth-2D]
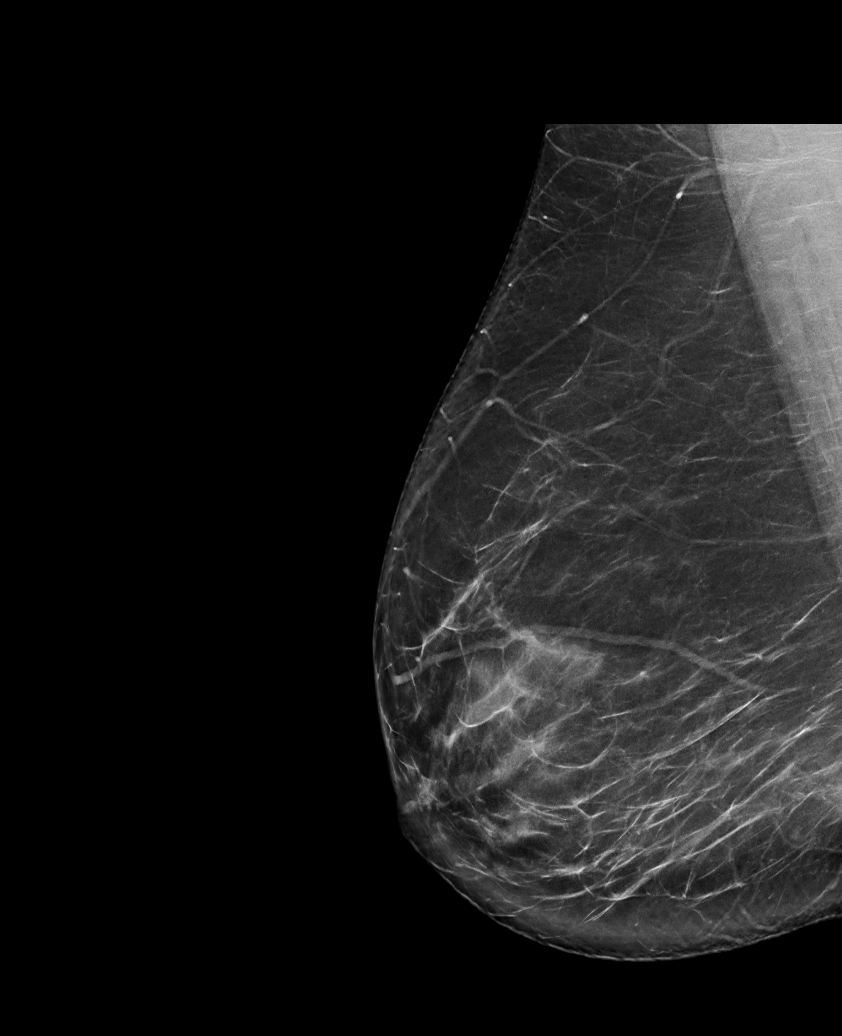

[L MLO synth-2D (2 of 2)]
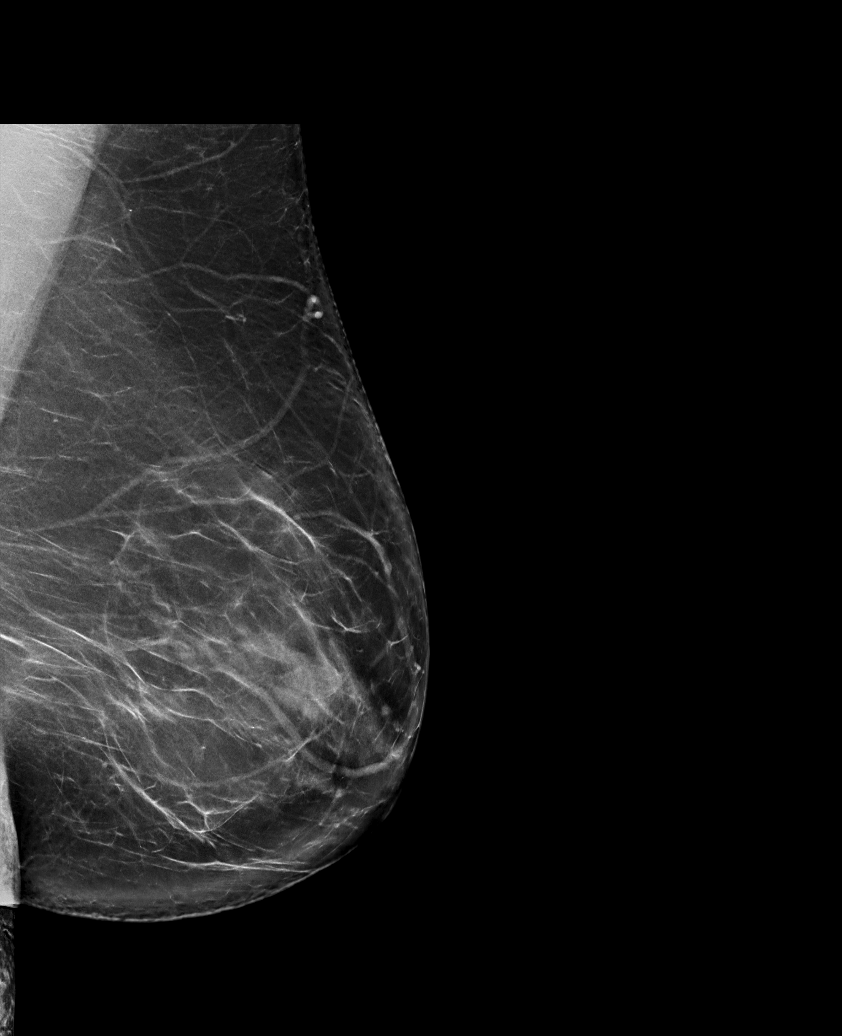

[L CC synth-2D]
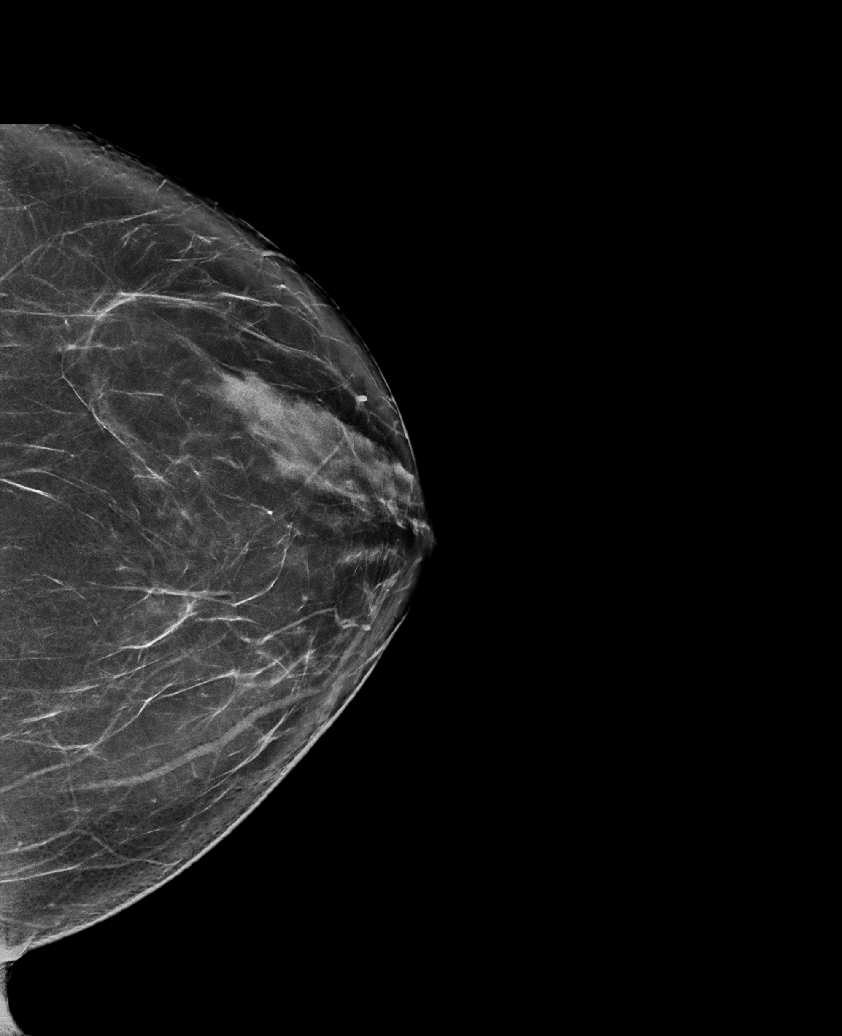

[R CC synth-2D]
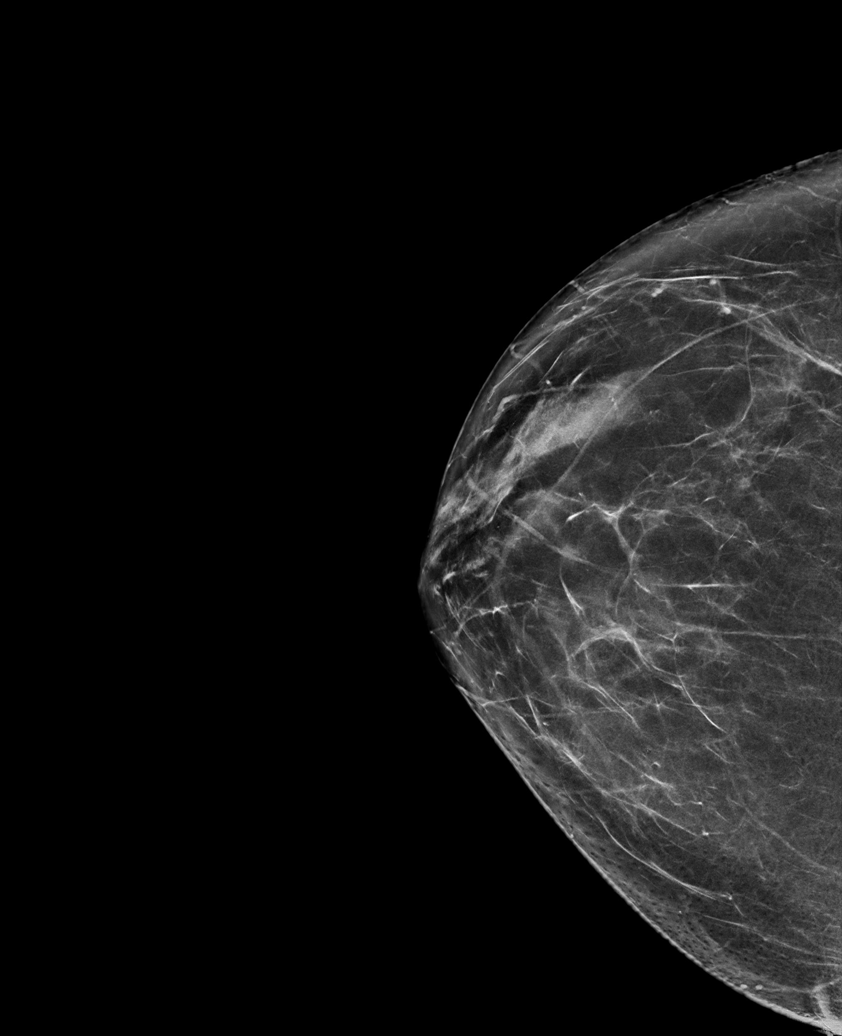

[R MLO tomo · tomo slice 45/88.0]
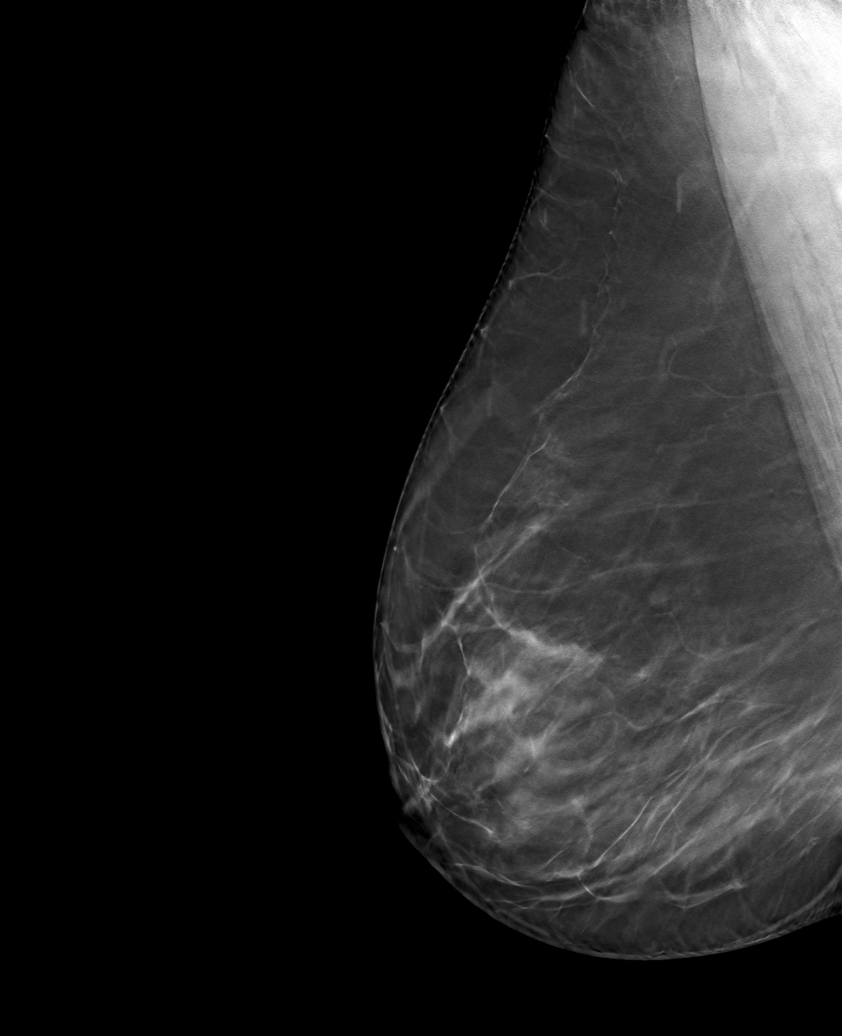

[6 of 30 positions shown; findings below may reference images not displayed]

ACR Breast Density Category b: There are scattered areas of
fibroglandular density.
FINDINGS: There are no findings suspicious for malignancy. Images were
processed with CAD.
IMPRESSION: No mammographic evidence of malignancy. A result letter of this
screening mammogram will be mailed directly to the patient.

RECOMMENDATION:
Screening mammogram in one year. (Code:Y5-G-EJ6)

BI-RADS CATEGORY  1: Negative.

## 2022-06-05 ENCOUNTER — Other Ambulatory Visit: Payer: Self-pay | Admitting: Internal Medicine

## 2022-06-05 DIAGNOSIS — Z720 Tobacco use: Secondary | ICD-10-CM

## 2022-06-25 LAB — COLOGUARD: COLOGUARD: NEGATIVE

## 2022-07-11 ENCOUNTER — Inpatient Hospital Stay: Admission: RE | Admit: 2022-07-11 | Payer: Self-pay | Source: Ambulatory Visit

## 2022-10-14 ENCOUNTER — Other Ambulatory Visit: Payer: Self-pay | Admitting: Internal Medicine

## 2022-10-14 DIAGNOSIS — Z720 Tobacco use: Secondary | ICD-10-CM

## 2022-12-23 ENCOUNTER — Ambulatory Visit
Admission: RE | Admit: 2022-12-23 | Discharge: 2022-12-23 | Disposition: A | Discharge status: Home / Self Care | Payer: Commercial Managed Care - PPO | Source: Ambulatory Visit | Attending: Internal Medicine | Admitting: Internal Medicine

## 2022-12-23 DIAGNOSIS — Z720 Tobacco use: Secondary | ICD-10-CM

## 2023-12-09 ENCOUNTER — Other Ambulatory Visit: Payer: Self-pay | Admitting: Internal Medicine

## 2023-12-09 DIAGNOSIS — Z720 Tobacco use: Secondary | ICD-10-CM

## 2024-04-22 ENCOUNTER — Other Ambulatory Visit: Payer: Self-pay | Admitting: Medical Genetics

## 2024-09-01 ENCOUNTER — Other Ambulatory Visit: Payer: Self-pay | Admitting: Medical Genetics

## 2024-09-01 DIAGNOSIS — Z006 Encounter for examination for normal comparison and control in clinical research program: Secondary | ICD-10-CM
